# Patient Record
Sex: Male | Born: 1980 | Race: White | Hispanic: No | Marital: Single | Smoking: Current every day smoker
Health system: Southern US, Community
[De-identification: ages and names within clinical notes are randomized; demographics above are authoritative.]

## PROBLEM LIST (undated history)

## (undated) DIAGNOSIS — E119 Type 2 diabetes mellitus without complications: Secondary | ICD-10-CM

---

## 2013-03-10 ENCOUNTER — Emergency Department (HOSPITAL_BASED_OUTPATIENT_CLINIC_OR_DEPARTMENT_OTHER): Payer: Self-pay

## 2013-03-10 ENCOUNTER — Encounter (HOSPITAL_BASED_OUTPATIENT_CLINIC_OR_DEPARTMENT_OTHER): Payer: Self-pay | Admitting: Emergency Medicine

## 2013-03-10 ENCOUNTER — Inpatient Hospital Stay (HOSPITAL_BASED_OUTPATIENT_CLINIC_OR_DEPARTMENT_OTHER)
Admission: EM | Admit: 2013-03-10 | Discharge: 2013-03-17 | DRG: 854 | Disposition: A | Discharge status: Home / Self Care | Payer: Self-pay | Attending: Internal Medicine | Admitting: Internal Medicine

## 2013-03-10 DIAGNOSIS — Z794 Long term (current) use of insulin: Secondary | ICD-10-CM

## 2013-03-10 DIAGNOSIS — M2420 Disorder of ligament, unspecified site: Secondary | ICD-10-CM | POA: Diagnosis present

## 2013-03-10 DIAGNOSIS — E119 Type 2 diabetes mellitus without complications: Secondary | ICD-10-CM | POA: Diagnosis present

## 2013-03-10 DIAGNOSIS — R6510 Systemic inflammatory response syndrome (SIRS) of non-infectious origin without acute organ dysfunction: Secondary | ICD-10-CM

## 2013-03-10 DIAGNOSIS — IMO0001 Reserved for inherently not codable concepts without codable children: Secondary | ICD-10-CM | POA: Diagnosis present

## 2013-03-10 DIAGNOSIS — E1165 Type 2 diabetes mellitus with hyperglycemia: Secondary | ICD-10-CM

## 2013-03-10 DIAGNOSIS — I498 Other specified cardiac arrhythmias: Secondary | ICD-10-CM | POA: Diagnosis present

## 2013-03-10 DIAGNOSIS — L0390 Cellulitis, unspecified: Secondary | ICD-10-CM | POA: Insufficient documentation

## 2013-03-10 DIAGNOSIS — F17200 Nicotine dependence, unspecified, uncomplicated: Secondary | ICD-10-CM | POA: Diagnosis present

## 2013-03-10 DIAGNOSIS — L0291 Cutaneous abscess, unspecified: Secondary | ICD-10-CM | POA: Diagnosis present

## 2013-03-10 DIAGNOSIS — D72829 Elevated white blood cell count, unspecified: Secondary | ICD-10-CM | POA: Diagnosis present

## 2013-03-10 DIAGNOSIS — A419 Sepsis, unspecified organism: Secondary | ICD-10-CM | POA: Diagnosis present

## 2013-03-10 DIAGNOSIS — R000 Tachycardia, unspecified: Secondary | ICD-10-CM | POA: Diagnosis present

## 2013-03-10 DIAGNOSIS — Z79899 Other long term (current) drug therapy: Secondary | ICD-10-CM

## 2013-03-10 DIAGNOSIS — I10 Essential (primary) hypertension: Secondary | ICD-10-CM | POA: Diagnosis present

## 2013-03-10 DIAGNOSIS — R600 Localized edema: Secondary | ICD-10-CM | POA: Diagnosis present

## 2013-03-10 DIAGNOSIS — M629 Disorder of muscle, unspecified: Secondary | ICD-10-CM | POA: Diagnosis present

## 2013-03-10 DIAGNOSIS — F1410 Cocaine abuse, uncomplicated: Secondary | ICD-10-CM | POA: Diagnosis present

## 2013-03-10 DIAGNOSIS — IMO0002 Reserved for concepts with insufficient information to code with codable children: Secondary | ICD-10-CM | POA: Diagnosis present

## 2013-03-10 DIAGNOSIS — A4101 Sepsis due to Methicillin susceptible Staphylococcus aureus: Principal | ICD-10-CM | POA: Diagnosis present

## 2013-03-10 HISTORY — DX: Type 2 diabetes mellitus without complications: E11.9

## 2013-03-10 LAB — CBC WITH DIFFERENTIAL/PLATELET
BAND NEUTROPHILS: 18 % — AB (ref 0–10)
Basophils Absolute: 0 10*3/uL (ref 0.0–0.1)
Basophils Relative: 0 % (ref 0–1)
EOS ABS: 0.7 10*3/uL (ref 0.0–0.7)
Eosinophils Relative: 2 % (ref 0–5)
HEMATOCRIT: 41.1 % (ref 39.0–52.0)
Hemoglobin: 13.9 g/dL (ref 13.0–17.0)
LYMPHS ABS: 1 10*3/uL (ref 0.7–4.0)
Lymphocytes Relative: 3 % — ABNORMAL LOW (ref 12–46)
MCH: 28.3 pg (ref 26.0–34.0)
MCHC: 33.8 g/dL (ref 30.0–36.0)
MCV: 83.5 fL (ref 78.0–100.0)
Monocytes Absolute: 2.3 10*3/uL — ABNORMAL HIGH (ref 0.1–1.0)
Monocytes Relative: 7 % (ref 3–12)
NEUTROS ABS: 29 10*3/uL — AB (ref 1.7–7.7)
Neutrophils Relative %: 70 % (ref 43–77)
Platelets: 302 10*3/uL (ref 150–400)
RBC: 4.92 MIL/uL (ref 4.22–5.81)
RDW: 12.9 % (ref 11.5–15.5)
WBC: 33 10*3/uL — ABNORMAL HIGH (ref 4.0–10.5)

## 2013-03-10 LAB — BASIC METABOLIC PANEL
BUN: 17 mg/dL (ref 6–23)
CO2: 22 mEq/L (ref 19–32)
CREATININE: 0.8 mg/dL (ref 0.50–1.35)
Calcium: 9.7 mg/dL (ref 8.4–10.5)
Chloride: 95 mEq/L — ABNORMAL LOW (ref 96–112)
GFR calc Af Amer: >90 mL/min (ref >90)
GFR calc non Af Amer: >90 mL/min (ref >90)
GLUCOSE: 491 mg/dL — AB (ref 70–99)
Potassium: 4 mEq/L (ref 3.7–5.3)
Sodium: 134 mEq/L — ABNORMAL LOW (ref 137–147)

## 2013-03-10 LAB — GLUCOSE, CAPILLARY
GLUCOSE-CAPILLARY: 226 mg/dL — AB (ref 70–99)
GLUCOSE-CAPILLARY: 250 mg/dL — AB (ref 70–99)

## 2013-03-10 LAB — CG4 I-STAT (LACTIC ACID): LACTIC ACID, VENOUS: 2.78 mmol/L — AB (ref 0.5–2.2)

## 2013-03-10 MED ORDER — SODIUM CHLORIDE 0.9 % IJ SOLN
3.0000 mL | Freq: Two times a day (BID) | INTRAMUSCULAR | Status: DC
  Administered 2013-03-11 – 2013-03-16 (×10): 3 mL via INTRAVENOUS

## 2013-03-10 MED ORDER — VANCOMYCIN HCL IN DEXTROSE 1-5 GM/200ML-% IV SOLN
1000.0000 mg | Freq: Two times a day (BID) | INTRAVENOUS | Status: DC
  Administered 2013-03-11 – 2013-03-15 (×8): 1000 mg via INTRAVENOUS
  Filled 2013-03-10 (×9): qty 200

## 2013-03-10 MED ORDER — PIPERACILLIN-TAZOBACTAM 3.375 G IVPB
3.3750 g | Freq: Three times a day (TID) | INTRAVENOUS | Status: DC
  Administered 2013-03-11 – 2013-03-14 (×11): 3.375 g via INTRAVENOUS
  Filled 2013-03-10 (×13): qty 50

## 2013-03-10 MED ORDER — DOCUSATE SODIUM 100 MG PO CAPS
100.0000 mg | ORAL_CAPSULE | Freq: Two times a day (BID) | ORAL | Status: DC
  Administered 2013-03-11 – 2013-03-17 (×14): 100 mg via ORAL
  Filled 2013-03-10 (×16): qty 1

## 2013-03-10 MED ORDER — SODIUM CHLORIDE 0.9 % IV SOLN
INTRAVENOUS | Status: DC
  Administered 2013-03-11: 09:00:00 via INTRAVENOUS
  Administered 2013-03-11: 1000 mL via INTRAVENOUS
  Administered 2013-03-11 – 2013-03-12 (×2): via INTRAVENOUS
  Administered 2013-03-13: 125 mL/h via INTRAVENOUS
  Administered 2013-03-13: 17:00:00 via INTRAVENOUS
  Administered 2013-03-14: 20 mL/h via INTRAVENOUS
  Administered 2013-03-14 – 2013-03-16 (×2): via INTRAVENOUS

## 2013-03-10 MED ORDER — MORPHINE SULFATE 4 MG/ML IJ SOLN
4.0000 mg | INTRAMUSCULAR | Status: AC | PRN
  Administered 2013-03-10 (×3): 4 mg via INTRAVENOUS
  Filled 2013-03-10 (×3): qty 1

## 2013-03-10 MED ORDER — ENOXAPARIN SODIUM 40 MG/0.4ML ~~LOC~~ SOLN: 40.0000 mg | SUBCUTANEOUS | Status: DC

## 2013-03-10 MED ORDER — ONDANSETRON HCL 4 MG PO TABS: 4.0000 mg | ORAL_TABLET | Freq: Four times a day (QID) | ORAL | Status: DC | PRN

## 2013-03-10 MED ORDER — ACETAMINOPHEN 500 MG PO TABS
1000.0000 mg | ORAL_TABLET | Freq: Once | ORAL | Status: AC
  Administered 2013-03-10: 1000 mg via ORAL

## 2013-03-10 MED ORDER — PIPERACILLIN-TAZOBACTAM 3.375 G IVPB 30 MIN
4.5000 g | Freq: Once | INTRAVENOUS | Status: DC
  Filled 2013-03-10: qty 100

## 2013-03-10 MED ORDER — ONDANSETRON HCL 4 MG/2ML IJ SOLN
4.0000 mg | Freq: Once | INTRAMUSCULAR | Status: AC
  Administered 2013-03-10: 4 mg via INTRAVENOUS
  Filled 2013-03-10: qty 2

## 2013-03-10 MED ORDER — ACETAMINOPHEN 500 MG PO TABS
ORAL_TABLET | ORAL | Status: AC
  Administered 2013-03-10: 1000 mg via ORAL
  Filled 2013-03-10: qty 2

## 2013-03-10 MED ORDER — ONDANSETRON HCL 4 MG/2ML IJ SOLN: 4.0000 mg | Freq: Four times a day (QID) | INTRAMUSCULAR | Status: DC | PRN

## 2013-03-10 MED ORDER — HYDROCODONE-ACETAMINOPHEN 5-325 MG PO TABS
1.0000 | ORAL_TABLET | ORAL | Status: DC | PRN
  Administered 2013-03-11: 1 via ORAL
  Administered 2013-03-11 (×3): 2 via ORAL
  Administered 2013-03-12: 1 via ORAL
  Administered 2013-03-12 – 2013-03-13 (×2): 2 via ORAL
  Filled 2013-03-10: qty 2
  Filled 2013-03-10: qty 1
  Filled 2013-03-10 (×4): qty 2

## 2013-03-10 MED ORDER — PANTOPRAZOLE SODIUM 40 MG PO TBEC
40.0000 mg | DELAYED_RELEASE_TABLET | Freq: Every day | ORAL | Status: DC
  Administered 2013-03-11 – 2013-03-16 (×6): 40 mg via ORAL
  Filled 2013-03-10 (×6): qty 1

## 2013-03-10 MED ORDER — SODIUM CHLORIDE 0.9 % IV BOLUS (SEPSIS)
1000.0000 mL | Freq: Once | INTRAVENOUS | Status: AC
  Administered 2013-03-10: 1000 mL via INTRAVENOUS

## 2013-03-10 MED ORDER — INSULIN ASPART 100 UNIT/ML ~~LOC~~ SOLN
0.0000 [IU] | SUBCUTANEOUS | Status: DC
  Administered 2013-03-11: 3 [IU] via SUBCUTANEOUS
  Administered 2013-03-11: 5 [IU] via SUBCUTANEOUS
  Administered 2013-03-11: 3 [IU] via SUBCUTANEOUS

## 2013-03-10 MED ORDER — ACETAMINOPHEN 325 MG PO TABS
650.0000 mg | ORAL_TABLET | Freq: Four times a day (QID) | ORAL | Status: DC | PRN
  Administered 2013-03-11 – 2013-03-13 (×2): 650 mg via ORAL
  Filled 2013-03-10 (×2): qty 2

## 2013-03-10 MED ORDER — VANCOMYCIN HCL 10 G IV SOLR
1500.0000 mg | Freq: Once | INTRAVENOUS | Status: AC
  Filled 2013-03-10: qty 1500

## 2013-03-10 MED ORDER — ACETAMINOPHEN 650 MG RE SUPP: 650.0000 mg | Freq: Four times a day (QID) | RECTAL | Status: DC | PRN

## 2013-03-10 MED ORDER — VANCOMYCIN HCL 500 MG IV SOLR
INTRAVENOUS | Status: AC
  Administered 2013-03-10: 1500 mg via INTRAVENOUS
  Filled 2013-03-10: qty 1500

## 2013-03-10 NOTE — ED Provider Notes (Addendum)
CSN: [id number]     Arrival date & time 03/10/13  1708 History   First MD Initiated Contact with Patient 03/10/13 1731     Chief Complaint  Patient presents with  . Recurrent Skin Infections    HPI  Patient presents here with a small reddened area on his right arm. His left arm is red and markedly swollen. He has had fever at home. He regularly uses IV cocaine. Has had sugars abscesses in the past. Has history of non-insulin-dependent diabetes. No chest pain. Her shortness of breath. No rash or hemorrhagic lesions to his extremities. No vision changes no headaches  Past Medical History  Diagnosis Date  . Diabetes mellitus without complication    History reviewed. No pertinent past surgical history. No family history on file. History  Substance Use Topics  . Smoking status: Current Every Day Smoker  . Smokeless tobacco: Not on file  . Alcohol Use: No    Review of Systems  Constitutional: Positive for fever and fatigue. Negative for chills, diaphoresis and appetite change.  HENT: Negative for mouth sores, sore throat and trouble swallowing.   Eyes: Negative for visual disturbance.  Respiratory: Negative for cough, chest tightness, shortness of breath and wheezing.   Cardiovascular: Negative for chest pain.  Gastrointestinal: Negative for nausea, vomiting, abdominal pain, diarrhea and abdominal distention.  Endocrine: Negative for polydipsia, polyphagia and polyuria.  Genitourinary: Negative for dysuria, frequency and hematuria.  Musculoskeletal: Positive for myalgias. Negative for gait problem.       Bilateral arm pain. Left much greater than right.  Skin: Positive for color change and wound. Negative for pallor and rash.  Neurological: Negative for dizziness, syncope, light-headedness and headaches.  Hematological: Does not bruise/bleed easily.  Psychiatric/Behavioral: Negative for behavioral problems and confusion.    Allergies  Review of patient's allergies indicates no  known allergies.  Home Medications   Current Outpatient Rx  Name  Route  Sig  Dispense  Refill  . metFORMIN (GLUCOPHAGE) 1000 MG tablet   Oral   Take 1,000 mg by mouth 2 (two) times daily with a meal.          BP 161/91  Pulse 132  Temp(Src) 98.7 F (37.1 C) (Oral)  Resp 20  Ht 5\' 11"  (1.803 m)  Wt 224 lb (101.606 kg)  BMI 31.26 kg/m2  SpO2 100% Physical Exam  Constitutional: He is oriented to person, place, and time. He appears distressed.  An obese adult male. He get neck at 25-30 breaths per minute. Heart rate 130. Mentating well.  HENT:  Head: Normocephalic.  No abnormalities noted of his retina. No scleral hemorrhages.  Eyes: Conjunctivae are normal. Pupils are equal, round, and reactive to light. No scleral icterus.  Neck: Normal range of motion. Neck supple. No thyromegaly present.  No JVD.  Cardiovascular: Normal rate and regular rhythm.  Exam reveals no gallop and no friction rub.   No murmur heard. Tachycardic. No murmurs auscultated. He states any history murmurs as a child but not auscultated today.  Pulmonary/Chest: Effort normal and breath sounds normal. No respiratory distress. He has no wheezes. He has no rales.  Tachypneic but clear lungs well oxygenated  Abdominal: Soft. Bowel sounds are normal. He exhibits no distension. There is no tenderness. There is no rebound.  Musculoskeletal: Normal range of motion.       Arms: Neurological: He is alert and oriented to person, place, and time.  Skin: Skin is warm and dry. No rash noted.  Psychiatric: He  has a normal mood and affect. His behavior is normal.    ED Course  Procedures (including critical care time) Labs Review Labs Reviewed  CBC WITH DIFFERENTIAL - Abnormal; Notable for the following:    WBC 33.0 (*)    Lymphocytes Relative 3 (*)    Band Neutrophils 18 (*)    Neutro Abs 29.0 (*)    Monocytes Absolute 2.3 (*)    All other components within normal limits  BASIC METABOLIC PANEL - Abnormal;  Notable for the following:    Sodium 134 (*)    Chloride 95 (*)    Glucose, Bld 491 (*)    All other components within normal limits  CG4 I-STAT (LACTIC ACID) - Abnormal; Notable for the following:    Lactic Acid, Venous 2.78 (*)    All other components within normal limits  CULTURE, BLOOD (SINGLE)   Imaging Review No results found.  EKG Interpretation   None       MDM   1. Cellulitis    Compresses swelling of his arm. Lactic acid is minimally elevated. His hyperglycemia. Renal function is intact. Blood culture was obtained. Was given IV vancomycin. Using a bedside ultrasound I was able to isolate cannulate the vein in his right upper arm. Labs were obtained and it is infusing well.  Discussed case with Dr.          Rennis Pettyf [facility].  Plain film imaging of the arm and forearm are recommended. I think this is an excellent idea. These were obtained and do not show any sign of subcutaneous gas. Recheck temperature is 99.7. Heart rate is 129. His vancomycin is infusing. His only been given 500 cc of fluid. This is due to affect is a little peripheral IV. It is infusing well. We will continue to additional fluid boluses after his IV antibiotics are infused. [facility]. Step down bed has been ordered at hospitalist request because of persistent heart rate. His blood pressure is adequate at 147/85 he is oxygenating and mentating well.  Rolland PorterMark Camay Pedigo, MD 03/10/13 1926  Rolland PorterMark Craigory Toste, MD 03/10/13 [id number]

## 2013-03-10 NOTE — ED Notes (Signed)
Patients left arm is swollen, reddened and painful.  Accesses to both arms where patient uses IV drugs. States that left arm has been getting worse over three days, right arm is also reddened slightly.

## 2013-03-10 NOTE — ED Notes (Signed)
Patient transported to X-ray 

## 2013-03-10 NOTE — ED Notes (Signed)
MD at bedside. 

## 2013-03-10 NOTE — ED Notes (Signed)
I had to shave patient chest hair to get monitor leads to stay attached, patient is diaphorettic causeing lead wire pads to loosen as well.

## 2013-03-10 NOTE — ED Notes (Signed)
Patient awoke during my getting new vitals, he did not want me to get BP due to arm pain.

## 2013-03-10 NOTE — H&P (Addendum)
PCP: Dr. Welton Flakes in [place]    Chief Complaint:  Arm swelling  HPI: Jay Snyder is a 33 y.o. male   has a past medical history of Diabetes mellitus without complication.   Presented with  3-4 day hx of Left arm and right arm swelling. Patient was injecting cocaine and started to develop severe arm swelling. Reports fevers for the past few days up to 102.2. Denies cough or sore throat. No chest pain.  Patient is diabetic on metformin. Patient has been transferred to stepdown at [facility]. consulted orthopedics Dr. Roda Shutters who will evaluate patient given severity of infection.   Review of Systems:    Pertinent positives include: Fevers, chills,   Constitutional:  No weight loss, night sweats, fatigue, weight loss  HEENT:  No headaches, Difficulty swallowing,Tooth/dental problems,Sore throat,  No sneezing, itching, ear ache, nasal congestion, post nasal drip,  Cardio-vascular:  No chest pain, Orthopnea, PND, anasarca, dizziness, palpitations.no Bilateral lower extremity swelling  GI:  No heartburn, indigestion, abdominal pain, nausea, vomiting, diarrhea, change in bowel habits, loss of appetite, melena, blood in stool, hematemesis Resp:  no shortness of breath at rest. No dyspnea on exertion, No excess mucus, no productive cough, No non-productive cough, No coughing up of blood.No change in color of mucus.No wheezing. Skin:  no rash or lesions. No jaundice GU:  no dysuria, change in color of urine, no urgency or frequency. No straining to urinate.  No flank pain.  Musculoskeletal:  No joint pain or no joint swelling. No decreased range of motion. No back pain.  Psych:  No change in mood or affect. No depression or anxiety. No memory loss.  Neuro: no localizing neurological complaints, no tingling, no weakness, no double vision, no gait abnormality, no slurred speech, no confusion  Otherwise ROS are negative except for above, 10 systems were reviewed  Past Medical History: Past Medical  History  Diagnosis Date  . Diabetes mellitus without complication    History reviewed. No pertinent past surgical history.   Medications: Prior to Admission medications   Medication Sig Start Date End Date Taking? Authorizing Provider  metFORMIN (GLUCOPHAGE) 1000 MG tablet Take 1,000 mg by mouth 2 (two) times daily with a meal.   Yes Historical Provider, MD    Allergies:  No Known Allergies  Social History:  Ambulatory  Independently  Lives at  Home with family fiance is aware of drug abuse problem    reports that he has been smoking.  He does not have any smokeless tobacco history on file. He reports that he uses illicit drugs (IV and Cocaine). He reports that he does not drink alcohol.   Family History: family history includes Diabetes Mellitus II in his father and mother.    Physical Exam: Patient Vitals for the past 24 hrs:  BP Temp Temp src Pulse Resp SpO2 Height Weight  03/10/13 2307 138/66 mmHg 99.4 F (37.4 C) Oral 118 25 97 % - 105.6 kg (232 lb 12.9 oz)  03/10/13 2211 139/88 mmHg - - 128 - 98 % - -  03/10/13 2147 - 101.1 F (38.4 C) Oral - - - - -  03/10/13 2146 - - - 123 - 96 % - -  03/10/13 2111 140/77 mmHg 99.3 F (37.4 C) Oral 127 20 96 % - -  03/10/13 1723 161/91 mmHg 98.7 F (37.1 C) Oral 132 20 100 % 5\' 11"  (1.803 m) 101.606 kg (224 lb)    1. General:  in No Acute distress 2. Psychological: Alert and  Oriented 3. Head/ENT:   Moist   Mucous Membranes                          Head Non traumatic, neck supple                          Normal  Dentition 4. SKIN:  decreased Skin turgor,  Skin clean Dry and intact. Left upper ext significant swelling and erythema. Right upper ext with area of induration measuring 3 cm 5. Heart: Regular rate and rhythm no Murmur, Rub or gallop 6. Lungs: Clear to auscultation bilaterally, no wheezes or crackles   7. Abdomen: Soft, non-tender, Non distended 8. Lower extremities: no clubbing, cyanosis, or edema 9.  Neurologically Grossly intact, moving all 4 extremities equally 10. MSK: Normal range of motion  body mass index is 32.48 kg/(m^2).   Labs on Admission:   Recent Labs  03/10/13 1810  NA 134*  K 4.0  CL 95*  CO2 22  GLUCOSE 491*  BUN 17  CREATININE 0.80  CALCIUM 9.7   No results found for this basename: AST, ALT, ALKPHOS, BILITOT, PROT, ALBUMIN,  in the last 72 hours No results found for this basename: LIPASE, AMYLASE,  in the last 72 hours  Recent Labs  03/10/13 1810  WBC 33.0*  NEUTROABS 29.0*  HGB 13.9  HCT 41.1  MCV 83.5  PLT 302   No results found for this basename: CKTOTAL, CKMB, CKMBINDEX, TROPONINI,  in the last 72 hours No results found for this basename: TSH, T4TOTAL, FREET3, T3FREE, THYROIDAB,  in the last 72 hours No results found for this basename: VITAMINB12, FOLATE, FERRITIN, TIBC, IRON, RETICCTPCT,  in the last 72 hours No results found for this basename: HGBA1C    Estimated Creatinine Clearance: 166.9 ml/min (by C-G formula based on Cr of 0.8). ABG No results found for this basename: phart, pco2, po2, hco3, tco2, acidbasedef, o2sat     No results found for this basename: DDIMER    Cultures: No results found for this basename: sdes, specrequest, cult, reptstatus       Radiological Exams on Admission: Dg Forearm Left  03/10/2013   CLINICAL DATA:  Abscess posterior forearm.  EXAM: LEFT FOREARM - 2 VIEW  COMPARISON:  None.  FINDINGS: Imaged bones, joints and soft tissues appear normal.  IMPRESSION: Negative exam.   Electronically Signed   By: Drusilla Kanner M.D.   On: 03/10/2013 20:34   Dg Humerus Left  03/10/2013   CLINICAL DATA:  Abscess posterior forearm.  EXAM: LEFT HUMERUS - 2+ VIEW  COMPARISON:  None.  FINDINGS: Imaged bones, joints and soft tissues appear normal.  IMPRESSION: Negative exam.   Electronically Signed   By: Drusilla Kanner M.D.   On: 03/10/2013 20:33    Chart has been reviewed  Assessment/Plan   33 yo  M w hx of drug  abuse here with severe left arm cellulitis with possible elbow joint involvement with evidence of SIRS  Present on Admission:   . Cellulitis and abscess - broad-spectrum antibiotics including vancomycin and Zosyn, n.p.o. for possible procedure, MRI in the a.m. of humerus and forearm as well as elbow to evaluate for any drainable abscess spoke with orthopedics for repair with patient and will evaluate  . Type II or unspecified type diabetes mellitus without mention of complication, not stated as uncontrolled - sliding scale, hold metformin SIRS - cover with broad spectrum antibiotics.  IVF, watch for sepsis  Prophylaxis:  SCD, protonix  CODE STATUS: FULL CODE  Other plan as per orders.  I have spent a total of 65 min on this admission, time taken to discuss care with orthopedics and radiology  Alizzon Dioguardi 03/10/2013, 11:58 PM

## 2013-03-10 NOTE — Progress Notes (Signed)
ANTIBIOTIC CONSULT NOTE - INITIAL  Pharmacy Consult for vancomycin Indication: cellulitis  No Known Allergies  Patient Measurements: Height: 5\' 11"  (180.3 cm) Weight: 224 lb (101.606 kg) IBW/kg (Calculated) : 75.3  Vital Signs: Temp: 99.4 F (37.4 C) (02/01 2307) Temp src: Oral (02/01 2307) BP: 139/88 mmHg (02/01 2211) Pulse Rate: 128 (02/01 2211)  Labs:  Recent Labs  03/10/13 1810  WBC 33.0*  HGB 13.9  PLT 302  CREATININE 0.80   Estimated Creatinine Clearance: 163.9 ml/min (by C-G formula based on Cr of 0.8).   Microbiology: No results found for this or any previous visit (from the past 720 hour(s)).  Medical History: Past Medical History  Diagnosis Date  . Diabetes mellitus without complication     Medications:  Prescriptions prior to admission  Medication Sig Dispense Refill  . metFORMIN (GLUCOPHAGE) 1000 MG tablet Take 1,000 mg by mouth 2 (two) times daily with a meal.       Scheduled:  . docusate sodium  100 mg Oral BID  . enoxaparin (LOVENOX) injection  40 mg Subcutaneous Q24H  . [START ON 03/11/2013] insulin aspart  0-9 Units Subcutaneous Q4H  . [START ON 03/11/2013] pantoprazole  40 mg Oral Q1200  . piperacillin-tazobactam (ZOSYN)  IV  3.375 g Intravenous Q8H  . piperacillin-tazobactam  4.5 g Intravenous Once  . sodium chloride  3 mL Intravenous Q12H  . [START ON 03/11/2013] vancomycin  1,000 mg Intravenous Q12H   Infusions:  . sodium chloride      Assessment: 33yo male c/o swelling, redness, and pain of LUE that has worsened over 3d, uses IV drugs, to begin IV ABX for cellulitis.  Goal of Therapy:  Vancomycin trough level 10-15 mcg/ml  Plan:  Rec'd vanc 1500mg  IV in ED; will continue with vancomycin 1000mg  IV Q12H and monitor CBC, Cx, levels prn.  Vernard GamblesVeronda Suraiya Dickerson, PharmD, BCPS  03/10/2013,11:49 PM

## 2013-03-11 ENCOUNTER — Inpatient Hospital Stay (HOSPITAL_COMMUNITY): Payer: Self-pay

## 2013-03-11 DIAGNOSIS — L0390 Cellulitis, unspecified: Secondary | ICD-10-CM

## 2013-03-11 DIAGNOSIS — E1165 Type 2 diabetes mellitus with hyperglycemia: Secondary | ICD-10-CM | POA: Diagnosis present

## 2013-03-11 DIAGNOSIS — D72829 Elevated white blood cell count, unspecified: Secondary | ICD-10-CM | POA: Diagnosis present

## 2013-03-11 DIAGNOSIS — R600 Localized edema: Secondary | ICD-10-CM | POA: Diagnosis present

## 2013-03-11 DIAGNOSIS — A419 Sepsis, unspecified organism: Secondary | ICD-10-CM | POA: Diagnosis present

## 2013-03-11 DIAGNOSIS — R000 Tachycardia, unspecified: Secondary | ICD-10-CM | POA: Diagnosis present

## 2013-03-11 DIAGNOSIS — IMO0002 Reserved for concepts with insufficient information to code with codable children: Secondary | ICD-10-CM | POA: Diagnosis present

## 2013-03-11 DIAGNOSIS — L0291 Cutaneous abscess, unspecified: Secondary | ICD-10-CM

## 2013-03-11 LAB — COMPREHENSIVE METABOLIC PANEL
ALK PHOS: 181 U/L — AB (ref 39–117)
ALT: 31 U/L (ref 0–53)
AST: 34 U/L (ref 0–37)
Albumin: 2.1 g/dL — ABNORMAL LOW (ref 3.5–5.2)
BUN: 11 mg/dL (ref 6–23)
CHLORIDE: 95 meq/L — AB (ref 96–112)
CO2: 22 meq/L (ref 19–32)
Calcium: 8.8 mg/dL (ref 8.4–10.5)
Creatinine, Ser: 0.64 mg/dL (ref 0.50–1.35)
GLUCOSE: 192 mg/dL — AB (ref 70–99)
POTASSIUM: 3.5 meq/L — AB (ref 3.7–5.3)
Sodium: 132 mEq/L — ABNORMAL LOW (ref 137–147)
Total Bilirubin: 0.2 mg/dL — ABNORMAL LOW (ref 0.3–1.2)
Total Protein: 6.5 g/dL (ref 6.0–8.3)

## 2013-03-11 LAB — GLUCOSE, CAPILLARY
GLUCOSE-CAPILLARY: 118 mg/dL — AB (ref 70–99)
Glucose-Capillary: 214 mg/dL — ABNORMAL HIGH (ref 70–99)
Glucose-Capillary: 271 mg/dL — ABNORMAL HIGH (ref 70–99)
Glucose-Capillary: 152 mg/dL — ABNORMAL HIGH (ref 70–99)
Glucose-Capillary: 211 mg/dL — ABNORMAL HIGH (ref 70–99)

## 2013-03-11 LAB — CBC
HCT: 38.3 % — ABNORMAL LOW (ref 39.0–52.0)
Hemoglobin: 13.4 g/dL (ref 13.0–17.0)
MCH: 28.8 pg (ref 26.0–34.0)
MCHC: 35 g/dL (ref 30.0–36.0)
MCV: 82.4 fL (ref 78.0–100.0)
Platelets: 281 10*3/uL (ref 150–400)
RBC: 4.65 MIL/uL (ref 4.22–5.81)
RDW: 12.7 % (ref 11.5–15.5)
WBC: 27.9 10*3/uL — AB (ref 4.0–10.5)

## 2013-03-11 LAB — HEMOGLOBIN A1C
Hgb A1c MFr Bld: 9.8 % — ABNORMAL HIGH (ref <5.7)
Mean Plasma Glucose: 235 mg/dL — ABNORMAL HIGH (ref <117)

## 2013-03-11 LAB — LACTIC ACID, PLASMA: LACTIC ACID, VENOUS: 1.1 mmol/L (ref 0.5–2.2)

## 2013-03-11 LAB — MRSA PCR SCREENING: MRSA by PCR: NEGATIVE

## 2013-03-11 LAB — MAGNESIUM: Magnesium: 1.7 mg/dL (ref 1.5–2.5)

## 2013-03-11 LAB — TSH: TSH: 1.496 u[IU]/mL (ref 0.350–4.500)

## 2013-03-11 LAB — PHOSPHORUS: Phosphorus: 2.3 mg/dL (ref 2.3–4.6)

## 2013-03-11 LAB — HIV ANTIBODY (ROUTINE TESTING W REFLEX): HIV: NONREACTIVE

## 2013-03-11 MED ORDER — FAMOTIDINE IN NACL 20-0.9 MG/50ML-% IV SOLN
20.0000 mg | Freq: Two times a day (BID) | INTRAVENOUS | Status: DC
  Administered 2013-03-11 – 2013-03-12 (×3): 20 mg via INTRAVENOUS
  Filled 2013-03-11 (×4): qty 50

## 2013-03-11 MED ORDER — KETOROLAC TROMETHAMINE 15 MG/ML IJ SOLN
15.0000 mg | Freq: Three times a day (TID) | INTRAMUSCULAR | Status: DC
  Administered 2013-03-11: 15 mg via INTRAVENOUS
  Filled 2013-03-11 (×3): qty 1

## 2013-03-11 MED ORDER — MORPHINE SULFATE 2 MG/ML IJ SOLN
2.0000 mg | INTRAMUSCULAR | Status: DC | PRN
  Administered 2013-03-12 – 2013-03-17 (×34): 2 mg via INTRAVENOUS
  Filled 2013-03-11 (×35): qty 1

## 2013-03-11 MED ORDER — DOXYCYCLINE HYCLATE 100 MG PO TABS
100.0000 mg | ORAL_TABLET | Freq: Two times a day (BID) | ORAL | Status: DC
  Administered 2013-03-11: 100 mg via ORAL
  Filled 2013-03-11 (×3): qty 1

## 2013-03-11 MED ORDER — OXYCODONE HCL 5 MG PO TABS: 5.0000 mg | ORAL_TABLET | ORAL | Status: DC | PRN

## 2013-03-11 MED ORDER — HEPARIN SODIUM (PORCINE) 5000 UNIT/ML IJ SOLN
5000.0000 [IU] | Freq: Three times a day (TID) | INTRAMUSCULAR | Status: DC
  Administered 2013-03-11: 5000 [IU] via SUBCUTANEOUS
  Filled 2013-03-11 (×3): qty 1

## 2013-03-11 MED ORDER — LEVOFLOXACIN 750 MG PO TABS
750.0000 mg | ORAL_TABLET | Freq: Every day | ORAL | Status: DC
  Administered 2013-03-11 – 2013-03-13 (×3): 750 mg via ORAL
  Filled 2013-03-11 (×3): qty 1

## 2013-03-11 MED ORDER — INSULIN ASPART 100 UNIT/ML ~~LOC~~ SOLN
0.0000 [IU] | SUBCUTANEOUS | Status: DC
Start: 2013-03-11 — End: 2013-03-17
  Administered 2013-03-11: 5 [IU] via SUBCUTANEOUS
  Administered 2013-03-11: 8 [IU] via SUBCUTANEOUS
  Administered 2013-03-12 – 2013-03-13 (×5): 3 [IU] via SUBCUTANEOUS
  Administered 2013-03-13: 5 [IU] via SUBCUTANEOUS
  Administered 2013-03-13 (×2): 3 [IU] via SUBCUTANEOUS
  Administered 2013-03-13: 8 [IU] via SUBCUTANEOUS
  Administered 2013-03-13: 3 [IU] via SUBCUTANEOUS
  Administered 2013-03-14: 5 [IU] via SUBCUTANEOUS
  Administered 2013-03-14: 3 [IU] via SUBCUTANEOUS
  Administered 2013-03-14: 8 [IU] via SUBCUTANEOUS
  Administered 2013-03-14 – 2013-03-15 (×6): 5 [IU] via SUBCUTANEOUS
  Administered 2013-03-16 (×3): 3 [IU] via SUBCUTANEOUS
  Administered 2013-03-16 (×2): 5 [IU] via SUBCUTANEOUS
  Administered 2013-03-17 (×3): 2 [IU] via SUBCUTANEOUS

## 2013-03-11 MED ORDER — INSULIN DETEMIR 100 UNIT/ML ~~LOC~~ SOLN
10.0000 [IU] | Freq: Every day | SUBCUTANEOUS | Status: DC
  Administered 2013-03-11 – 2013-03-17 (×7): 10 [IU] via SUBCUTANEOUS
  Filled 2013-03-11 (×7): qty 0.1

## 2013-03-11 NOTE — Progress Notes (Addendum)
Jay Snyder - Stepdown / ICU Progress Note  Jay Snyder:[id number] DOB: 15-Oct-1980 DOA: 2/Snyder/2015 PCP: No primary provider on file.  Brief narrative: 33 year old male patient known diabetic on metformin at home. Presented with a 3-4 day history of severe left arm swelling. Patient developed the symptoms after injecting cocaine into his arm. Also endorsed fever up to 102.2.   Assessment/Plan:    Sepsis -Source upper extremity cellulitis -BP stable and actually hypertensive but remains tachycardic and tachypneic -Continue empiric treatment with antibiotics and other supportive care    Cellulitis and abscess / Edema of upper extremity -Some swelling right upper extremity but primarily left upper extremity -MRI pending -Appreciate orthopedic assistance-n.p.o. until MRI completed in event patient needs to go to the OR based on MRI findings -Monitor closely for signs of impending compartment syndrome -Treat pain with Tylenol, morphine, oxycodone -Begin scheduled low-dose IV Toradol every 8 hours for 6 doses only-use twice a day Pepcid for GI protection -Unclear if true IV drug abuse or skin popping but as precaution since only one blood culture was obtained in the ER we'll obtain another set    Limited IV access -likely due to IVD use + sepsis - unable to obtain R arm PICC today due to fear of local cellulitis - asked IR to place CVL in AM - also called PCCM to place CVL tonight if able as I would like access sooner than later w/ sepsis and potential for rapid decompensation - dose w/ oral levaquin (broad coverage in a diabetic) until IV abx able to be given     Diabetes mellitus type 2, uncontrolled -Begin low-dose Levemir with moderate sliding scale insulin -Check hemoglobin A1c    Sinus tachycardia -does not appear to be dehydrated and seems to be more of a marker of acute inflammatory response and sepsis -IV fluids while n.p.o.  DVT prophylaxis: SCDs only for now in case  pt needs to go to OR emergently  Code Status: Full Family Communication: Family member at bedside Disposition Plan/Expected LOS: Remain in step down  Consultants: Orthopedics  Procedures: None  Antibiotics: Zosyn 2/Snyder >>> Vancomycin 2/Snyder >>> Oral Levaquin 2/2 >>  HPI/Subjective: Patient alert and complaining of some mild discomfort left upper extremity. No chest pain or shortness of breath  Objective: Blood pressure 140/96, pulse 115, temperature 99.4 F (37.4 C), temperature source Oral, resp. rate 29, height 5\' 11"  (Snyder.803 m), weight 232 lb 12.9 oz (105.6 kg), SpO2 92.00%.  Intake/Output Summary (Last 24 hours) at 03/11/13 1404 Last data filed at 03/11/13 1058  Gross per 24 hour  Intake 1043.58 ml  Output   1325 ml  Net -281.42 ml   Exam: General: No acute respiratory distress but has resting tachypnea-somewhat toxic appearing Lungs: Clear to auscultation bilaterally without wheezes or crackles, RA Cardiovascular: Regular tachycardic rate is less than 120 beats per minute and rhythm without murmur gallop or rub normal S1 and S2, no peripheral edema or JVD Abdomen: Nontender, nondistended, soft, bowel sounds positive, no rebound, no ascites, no appreciable mass Musculoskeletal: No significant cyanosis, clubbing of bilateral lower extremities - L arm markedly swollen but no paralysis or palor and pt denies parasthesias Neurological: Alert and oriented x 3, moves all extremities x 4 without focal neurological deficits, CN 2-12 intact  Scheduled Meds:  Scheduled Meds: . docusate sodium  100 mg Oral BID  . famotidine (PEPCID) IV  20 mg Intravenous Q12H  . insulin aspart  0-15 Units Subcutaneous Q4H  .  insulin detemir  10 Units Subcutaneous Daily  . ketorolac  15 mg Intravenous Q8H  . pantoprazole  40 mg Oral Q1200  . piperacillin-tazobactam (ZOSYN)  IV  3.375 g Intravenous Q8H  . piperacillin-tazobactam  4.5 g Intravenous Once  . sodium chloride  3 mL Intravenous Q12H  .  vancomycin  Snyder,000 mg Intravenous Q12H    Data Reviewed: Basic Metabolic Panel:  Recent Labs Lab 03/10/13 1810 03/11/13 0130  NA 134* 132*  K 4.0 3.5*  CL 95* 95*  CO2 22 22  GLUCOSE 491* 192*  BUN 17 11  CREATININE 0.80 0.64  CALCIUM 9.7 8.8  MG  --  Snyder.7  PHOS  --  2.3   Liver Function Tests:  Recent Labs Lab 03/11/13 0130  AST 34  ALT 31  ALKPHOS 181*  BILITOT 0.2*  PROT 6.5  ALBUMIN 2.Snyder*   CBC:  Recent Labs Lab 03/10/13 1810 03/11/13 0130  WBC 33.0* 27.9*  NEUTROABS 29.0*  --   HGB 13.9 13.4  HCT 41.Snyder 38.3*  MCV 83.5 82.4  PLT 302 281   CBG:  Recent Labs Lab 03/10/13 2213 03/10/13 2354 03/11/13 0432 03/11/13 0726 03/11/13 1207  GLUCAP 250* 226* 214* 271* 152*    Recent Results (from the past 240 hour(s))  MRSA PCR SCREENING     Status: None   Collection Time    03/10/13 11:07 PM      Result Value Range Status   MRSA by PCR NEGATIVE  NEGATIVE Final   Comment:            The GeneXpert MRSA Assay (FDA     approved for NASAL specimens     only), is one component of a     comprehensive MRSA colonization     surveillance program. It is not     intended to diagnose MRSA     infection nor to guide or     monitor treatment for     MRSA infections.     Studies:  Recent x-ray studies have been reviewed in detail by the Attending Physician  Time spent :  35mins     Junious Silkllison Ellis, ANP [facility] Office  [phone] Pager [phone]  **If unable to reach the above provider after paging please contact the Flow Manager @ [phone]  On-Call/Text Page:      [email or web address]      password [email or web address]  If 7PM-7AM, please contact night-coverage [email or web address] Password [email or web address] 03/11/2013, 2:04 PM   LOS: Snyder day   I have personally examined this patient and reviewed the entire database. I have reviewed the above note, made any necessary editorial changes, and agree with its content.  Lonia BloodJeffrey T. Aireana Ryland, MD [facility]

## 2013-03-11 NOTE — Procedures (Signed)
Central Venous Catheter Insertion Procedure Note Jay BobJeremy Snyder [id number] 05/10/1980  Procedure: Insertion of Central Venous Catheter Indications: Drug and/or fluid administration  Procedure Details Consent: Risks of procedure as well as the alternatives and risks of each were explained to the (patient/caregiver).  Consent for procedure obtained. Time Out: Verified patient identification, verified procedure, site/side was marked, verified correct patient position, special equipment/implants available, medications/allergies/relevent history reviewed, required imaging and test results available.  Performed  Maximum sterile technique was used including antiseptics, cap, gloves, gown, hand hygiene, mask and sheet. Skin prep: Chlorhexidine; local anesthetic administered A antimicrobial bonded/coated triple lumen catheter was placed in the right internal jugular vein using the Seldinger technique.  Evaluation Blood flow good Complications: No apparent complications Patient did tolerate procedure well. Chest X-ray ordered to verify placement.  CXR: pending.  Performed by Jay BrimBrandi Ollis, NP with u/s guidance.  I was present for procedure.  Jay HellingVineet Roberta Angell, MD [facility] 03/11/2013, 9:33 PM Pager:  [phone] After 3pm call: [phone]

## 2013-03-11 NOTE — Progress Notes (Signed)
Inpatient Diabetes Program Recommendations  AACE/ADA: New Consensus Statement on Inpatient Glycemic Control (2013)  Target Ranges:  Prepandial:   less than 140 mg/dL      Peak postprandial:   less than 180 mg/dL (1-2 hours)      Critically ill patients:  140 - 180 mg/dL  Results for Phylliss BobANCE, Rahmel (MRN [id number]) as of 03/11/2013 08:26  Ref. Range 03/10/2013 22:13 03/10/2013 23:54 03/11/2013 04:32 03/11/2013 07:26  Glucose-Capillary Latest Range: 70-99 mg/dL 657250 (H) 846226 (H) 962214 (H) 271 (H)   Diabetes history: T-2 Outpatient Diabetes medications: Metformin Current orders for Inpatient glycemic control: Novolog sensitive scale q4   Inpatient Diabetes Program Recommendations Insulin - Basal: consider adding low dose basal Lantus or Levemir 15 units  HgbA1C: pending  Will follow. Thank you  Piedad ClimesGina Jayln Branscom BSN, RN,CDE Inpatient Diabetes Coordinator [phone] (team pager)

## 2013-03-11 NOTE — Plan of Care (Signed)
Solitary blood culture ordered in ED therefore one set of blood cx's x 2 ordered now. BP up so [facility]  Junious SilkAllison Ellis, ANP

## 2013-03-11 NOTE — Progress Notes (Signed)
Orthopedic Tech Progress Note Patient Details:  Jay BobJeremy Snyder 29-Jan-1981 [id number]  Ortho Devices Type of Ortho Device: Sling immobilizer   [name], [name] 03/11/2013, 8:12 AM

## 2013-03-11 NOTE — Consult Note (Signed)
   ORTHOPAEDIC CONSULTATION  REQUESTING PHYSICIAN: Lonia BloodJeffrey T McClung, MD  Chief Complaint: Left arm pain and swelling  HPI: Phylliss BobJeremy Alfred is a 33 y.o. male who complains of left arm swelling and pain.  Recently was injecting cocaine into his arm.  Has had prior h/o this in the other arm.  Has had fevers up to 102.2 in the last few days.  He is diabetic.   Past Medical History  Diagnosis Date  . Diabetes mellitus without complication    History reviewed. No pertinent past surgical history. History   Social History  . Marital Status: Single    Spouse Name: N/A    Number of Children: N/A  . Years of Education: N/A   Social History Main Topics  . Smoking status: Current Every Day Smoker  . Smokeless tobacco: None  . Alcohol Use: No  . Drug Use: Yes    Special: IV, Cocaine  . Sexual Activity: None   Other Topics Concern  . None   Social History Narrative  . None   Family History  Problem Relation Age of Onset  . Diabetes Mellitus II Mother   . Diabetes Mellitus II Father    No Known Allergies Prior to Admission medications   Medication Sig Start Date End Date Taking? Authorizing Provider  metFORMIN (GLUCOPHAGE) 1000 MG tablet Take 1,000 mg by mouth 2 (two) times daily with a meal.   Yes Historical Provider, MD   Dg Forearm Left  03/10/2013   CLINICAL DATA:  Abscess posterior forearm.  EXAM: LEFT FOREARM - 2 VIEW  COMPARISON:  None.  FINDINGS: Imaged bones, joints and soft tissues appear normal.  IMPRESSION: Negative exam.   Electronically Signed   By: Drusilla Kannerhomas  Dalessio M.D.   On: 03/10/2013 20:34   Dg Humerus Left  03/10/2013   CLINICAL DATA:  Abscess posterior forearm.  EXAM: LEFT HUMERUS - 2+ VIEW  COMPARISON:  None.  FINDINGS: Imaged bones, joints and soft tissues appear normal.  IMPRESSION: Negative exam.   Electronically Signed   By: Drusilla Kannerhomas  Dalessio M.D.   On: 03/10/2013 20:33    Positive ROS: All other systems have been reviewed and were otherwise negative with the  exception of those mentioned in the HPI and as above.  Physical Exam: General: Alert, no acute distress Cardiovascular: No pedal edema Respiratory: No cyanosis, no use of accessory musculature GI: No organomegaly, abdomen is soft and non-tender Skin: No lesions in the area of chief complaint Neurologic: Sensation intact distally Psychiatric: Patient is competent for consent with normal mood and affect Lymphatic: No axillary or cervical lymphadenopathy  MUSCULOSKELETAL: LUE - severe swelling - cellulitis of arm - no definable abscess or fluctuance - NVI distally - TTP - not c/w compartment syndrome  Assessment: LUE swelling and cellulitis r/o abscess  Plan: - agree with MRI - keep NPO - may need surgery later today - agree with IV abx  Thank you for the consult and the opportunity to see Mr. Foye Clockance  N. Glee ArvinMichael Keviana Guida, MD [facility] [phone] 8:07 AM

## 2013-03-12 ENCOUNTER — Inpatient Hospital Stay (HOSPITAL_COMMUNITY): Payer: Self-pay | Admitting: Anesthesiology

## 2013-03-12 ENCOUNTER — Encounter (HOSPITAL_COMMUNITY)
Admission: EM | Disposition: A | Discharge status: Home / Self Care | Payer: Self-pay | Source: Home / Self Care | Attending: Internal Medicine

## 2013-03-12 ENCOUNTER — Encounter (HOSPITAL_COMMUNITY): Payer: Self-pay | Admitting: Anesthesiology

## 2013-03-12 ENCOUNTER — Inpatient Hospital Stay (HOSPITAL_COMMUNITY): Payer: Self-pay

## 2013-03-12 DIAGNOSIS — E1165 Type 2 diabetes mellitus with hyperglycemia: Secondary | ICD-10-CM

## 2013-03-12 DIAGNOSIS — A419 Sepsis, unspecified organism: Secondary | ICD-10-CM

## 2013-03-12 DIAGNOSIS — IMO0001 Reserved for inherently not codable concepts without codable children: Secondary | ICD-10-CM

## 2013-03-12 HISTORY — PX: I & D EXTREMITY: SHX5045

## 2013-03-12 LAB — COMPREHENSIVE METABOLIC PANEL
ALT: 32 U/L (ref 0–53)
AST: 33 U/L (ref 0–37)
Albumin: 2.1 g/dL — ABNORMAL LOW (ref 3.5–5.2)
Alkaline Phosphatase: 259 U/L — ABNORMAL HIGH (ref 39–117)
BILIRUBIN TOTAL: 0.2 mg/dL — AB (ref 0.3–1.2)
BUN: 11 mg/dL (ref 6–23)
CHLORIDE: 99 meq/L (ref 96–112)
CO2: 24 mEq/L (ref 19–32)
CREATININE: 0.63 mg/dL (ref 0.50–1.35)
Calcium: 8.1 mg/dL — ABNORMAL LOW (ref 8.4–10.5)
GFR calc Af Amer: >90 mL/min (ref >90)
GFR calc non Af Amer: >90 mL/min (ref >90)
Glucose, Bld: 148 mg/dL — ABNORMAL HIGH (ref 70–99)
Potassium: 3.6 mEq/L — ABNORMAL LOW (ref 3.7–5.3)
Sodium: 138 mEq/L (ref 137–147)
Total Protein: 6.4 g/dL (ref 6.0–8.3)

## 2013-03-12 LAB — GRAM STAIN

## 2013-03-12 LAB — CBC
HCT: 37 % — ABNORMAL LOW (ref 39.0–52.0)
Hemoglobin: 12.8 g/dL — ABNORMAL LOW (ref 13.0–17.0)
MCH: 28.8 pg (ref 26.0–34.0)
MCHC: 34.6 g/dL (ref 30.0–36.0)
MCV: 83.1 fL (ref 78.0–100.0)
PLATELETS: 347 10*3/uL (ref 150–400)
RBC: 4.45 MIL/uL (ref 4.22–5.81)
RDW: 13.2 % (ref 11.5–15.5)
WBC: 30.9 10*3/uL — AB (ref 4.0–10.5)

## 2013-03-12 LAB — GLUCOSE, CAPILLARY
GLUCOSE-CAPILLARY: 156 mg/dL — AB (ref 70–99)
Glucose-Capillary: 139 mg/dL — ABNORMAL HIGH (ref 70–99)
Glucose-Capillary: 159 mg/dL — ABNORMAL HIGH (ref 70–99)
Glucose-Capillary: 166 mg/dL — ABNORMAL HIGH (ref 70–99)
Glucose-Capillary: 188 mg/dL — ABNORMAL HIGH (ref 70–99)
Glucose-Capillary: 197 mg/dL — ABNORMAL HIGH (ref 70–99)

## 2013-03-12 SURGERY — IRRIGATION AND DEBRIDEMENT EXTREMITY
Anesthesia: General | Site: Arm Upper | Laterality: Bilateral

## 2013-03-12 MED ORDER — ROCURONIUM BROMIDE 50 MG/5ML IV SOLN
INTRAVENOUS | Status: AC
  Filled 2013-03-12: qty 1

## 2013-03-12 MED ORDER — OXYCODONE HCL 5 MG PO TABS
5.0000 mg | ORAL_TABLET | Freq: Once | ORAL | Status: AC | PRN
Start: 2013-03-12 — End: 2013-03-12
  Administered 2013-03-12: 5 mg via ORAL

## 2013-03-12 MED ORDER — PROPOFOL 10 MG/ML IV BOLUS
INTRAVENOUS | Status: DC | PRN
  Administered 2013-03-12: 50 mg via INTRAVENOUS
  Administered 2013-03-12: 200 mg via INTRAVENOUS

## 2013-03-12 MED ORDER — ONDANSETRON HCL 4 MG/2ML IJ SOLN
INTRAMUSCULAR | Status: AC
  Filled 2013-03-12: qty 2

## 2013-03-12 MED ORDER — HYDROMORPHONE HCL PF 1 MG/ML IJ SOLN
INTRAMUSCULAR | Status: AC
  Filled 2013-03-12: qty 2

## 2013-03-12 MED ORDER — OXYCODONE HCL 5 MG PO TABS
ORAL_TABLET | ORAL | Status: AC
  Filled 2013-03-12: qty 1

## 2013-03-12 MED ORDER — LACTATED RINGERS IV SOLN
INTRAVENOUS | Status: DC | PRN
  Administered 2013-03-12 (×2): via INTRAVENOUS

## 2013-03-12 MED ORDER — GADOBENATE DIMEGLUMINE 529 MG/ML IV SOLN
20.0000 mL | Freq: Once | INTRAVENOUS | Status: AC | PRN
  Administered 2013-03-12: 20 mL via INTRAVENOUS

## 2013-03-12 MED ORDER — LIDOCAINE HCL (CARDIAC) 20 MG/ML IV SOLN
INTRAVENOUS | Status: AC
  Filled 2013-03-12: qty 5

## 2013-03-12 MED ORDER — LABETALOL HCL 5 MG/ML IV SOLN
INTRAVENOUS | Status: DC | PRN
  Administered 2013-03-12: 5 mg via INTRAVENOUS

## 2013-03-12 MED ORDER — DIPHENHYDRAMINE HCL 12.5 MG/5ML PO ELIX
25.0000 mg | ORAL_SOLUTION | ORAL | Status: DC | PRN
  Administered 2013-03-16: 25 mg via ORAL
  Filled 2013-03-12 (×2): qty 10

## 2013-03-12 MED ORDER — POTASSIUM CHLORIDE CRYS ER 20 MEQ PO TBCR
40.0000 meq | EXTENDED_RELEASE_TABLET | Freq: Once | ORAL | Status: AC
  Administered 2013-03-12: 40 meq via ORAL
  Filled 2013-03-12: qty 2

## 2013-03-12 MED ORDER — SODIUM CHLORIDE 0.9 % IJ SOLN
INTRAMUSCULAR | Status: AC
  Filled 2013-03-12: qty 3

## 2013-03-12 MED ORDER — PROPOFOL 10 MG/ML IV BOLUS
INTRAVENOUS | Status: AC
  Filled 2013-03-12: qty 20

## 2013-03-12 MED ORDER — FAMOTIDINE 20 MG PO TABS
20.0000 mg | ORAL_TABLET | Freq: Two times a day (BID) | ORAL | Status: DC
  Administered 2013-03-12 – 2013-03-17 (×10): 20 mg via ORAL
  Filled 2013-03-12 (×13): qty 1

## 2013-03-12 MED ORDER — SODIUM CHLORIDE 0.9 % IR SOLN
Status: DC | PRN
  Administered 2013-03-12: 1000 mL

## 2013-03-12 MED ORDER — HYDROMORPHONE HCL PF 1 MG/ML IJ SOLN
0.2500 mg | INTRAMUSCULAR | Status: AC | PRN
  Administered 2013-03-12 (×8): 0.5 mg via INTRAVENOUS

## 2013-03-12 MED ORDER — PHENYLEPHRINE HCL 10 MG/ML IJ SOLN
INTRAMUSCULAR | Status: DC | PRN
  Administered 2013-03-12 (×2): 80 ug via INTRAVENOUS

## 2013-03-12 MED ORDER — FENTANYL CITRATE 0.05 MG/ML IJ SOLN
INTRAMUSCULAR | Status: AC
  Filled 2013-03-12: qty 5

## 2013-03-12 MED ORDER — HYDROMORPHONE HCL PF 1 MG/ML IJ SOLN: 0.2500 mg | INTRAMUSCULAR | Status: DC | PRN

## 2013-03-12 MED ORDER — ONDANSETRON HCL 4 MG/2ML IJ SOLN
INTRAMUSCULAR | Status: DC | PRN
  Administered 2013-03-12: 4 mg via INTRAVENOUS

## 2013-03-12 MED ORDER — FENTANYL CITRATE 0.05 MG/ML IJ SOLN
INTRAMUSCULAR | Status: DC | PRN
  Administered 2013-03-12 (×3): 50 ug via INTRAVENOUS
  Administered 2013-03-12: 100 ug via INTRAVENOUS
  Administered 2013-03-12: 50 ug via INTRAVENOUS
  Administered 2013-03-12 (×2): 100 ug via INTRAVENOUS
  Administered 2013-03-12: 50 ug via INTRAVENOUS
  Administered 2013-03-12 (×2): 100 ug via INTRAVENOUS
  Administered 2013-03-12 (×4): 50 ug via INTRAVENOUS
  Administered 2013-03-12: 100 ug via INTRAVENOUS
  Administered 2013-03-12: 50 ug via INTRAVENOUS
  Administered 2013-03-12: 100 ug via INTRAVENOUS
  Administered 2013-03-12: 50 ug via INTRAVENOUS

## 2013-03-12 MED ORDER — MIDAZOLAM HCL 2 MG/2ML IJ SOLN
INTRAMUSCULAR | Status: AC
  Filled 2013-03-12: qty 2

## 2013-03-12 MED ORDER — ONDANSETRON HCL 4 MG/2ML IJ SOLN: 4.0000 mg | Freq: Four times a day (QID) | INTRAMUSCULAR | Status: DC | PRN

## 2013-03-12 MED ORDER — OXYCODONE HCL 5 MG PO TABS
5.0000 mg | ORAL_TABLET | ORAL | Status: DC | PRN
  Administered 2013-03-13 – 2013-03-17 (×13): 10 mg via ORAL
  Filled 2013-03-12 (×14): qty 2

## 2013-03-12 MED ORDER — OXYCODONE HCL 5 MG/5ML PO SOLN: 5.0000 mg | Freq: Once | ORAL | Status: AC | PRN

## 2013-03-12 MED ORDER — ADULT MULTIVITAMIN W/MINERALS CH
1.0000 | ORAL_TABLET | Freq: Every day | ORAL | Status: DC
  Administered 2013-03-12 – 2013-03-17 (×6): 1 via ORAL
  Filled 2013-03-12 (×6): qty 1

## 2013-03-12 MED ORDER — 0.9 % SODIUM CHLORIDE (POUR BTL) OPTIME
TOPICAL | Status: DC | PRN
  Administered 2013-03-12: 1000 mL

## 2013-03-12 MED ORDER — HYDROCODONE-ACETAMINOPHEN 5-325 MG PO TABS
1.0000 | ORAL_TABLET | ORAL | Status: DC | PRN
  Administered 2013-03-13: 2 via ORAL
  Filled 2013-03-12 (×3): qty 2

## 2013-03-12 MED ORDER — MIDAZOLAM HCL 5 MG/5ML IJ SOLN
INTRAMUSCULAR | Status: DC | PRN
  Administered 2013-03-12: 2 mg via INTRAVENOUS

## 2013-03-12 MED ORDER — MORPHINE SULFATE 2 MG/ML IJ SOLN: 1.0000 mg | INTRAMUSCULAR | Status: DC | PRN

## 2013-03-12 MED ORDER — LABETALOL HCL 5 MG/ML IV SOLN
INTRAVENOUS | Status: AC
  Filled 2013-03-12: qty 4

## 2013-03-12 SURGICAL SUPPLY — 61 items
BANDAGE CONFORM 3  STR LF (GAUZE/BANDAGES/DRESSINGS) IMPLANT
BANDAGE ELASTIC 3 VELCRO ST LF (GAUZE/BANDAGES/DRESSINGS) ×3 IMPLANT
BANDAGE ELASTIC 4 VELCRO ST LF (GAUZE/BANDAGES/DRESSINGS) ×9 IMPLANT
BANDAGE GAUZE ELAST BULKY 4 IN (GAUZE/BANDAGES/DRESSINGS) ×15 IMPLANT
BLADE SURG 10 STRL SS (BLADE) ×3 IMPLANT
BNDG COHESIVE 1X5 TAN STRL LF (GAUZE/BANDAGES/DRESSINGS) IMPLANT
BNDG COHESIVE 4X5 TAN STRL (GAUZE/BANDAGES/DRESSINGS) IMPLANT
BNDG COHESIVE 6X5 TAN STRL LF (GAUZE/BANDAGES/DRESSINGS) IMPLANT
BNDG GAUZE STRTCH 6 (GAUZE/BANDAGES/DRESSINGS) IMPLANT
CLOTH BEACON ORANGE TIMEOUT ST (SAFETY) ×3 IMPLANT
CORDS BIPOLAR (ELECTRODE) IMPLANT
COVER SURGICAL LIGHT HANDLE (MISCELLANEOUS) ×3 IMPLANT
CUFF TOURNIQUET SINGLE 18IN (TOURNIQUET CUFF) ×3 IMPLANT
CUFF TOURNIQUET SINGLE 24IN (TOURNIQUET CUFF) IMPLANT
CUFF TOURNIQUET SINGLE 34IN LL (TOURNIQUET CUFF) IMPLANT
CUFF TOURNIQUET SINGLE 44IN (TOURNIQUET CUFF) IMPLANT
DRAPE ORTHO SPLIT 77X108 STRL (DRAPES)
DRAPE SURG 17X23 STRL (DRAPES) IMPLANT
DRAPE SURG ORHT 6 SPLT 77X108 (DRAPES) IMPLANT
DRAPE U-SHAPE 47X51 STRL (DRAPES) ×3 IMPLANT
DRSG ADAPTIC 3X8 NADH LF (GAUZE/BANDAGES/DRESSINGS) ×6 IMPLANT
DURAPREP 26ML APPLICATOR (WOUND CARE) ×3 IMPLANT
ELECT CAUTERY BLADE 6.4 (BLADE) ×6 IMPLANT
ELECT REM PT RETURN 9FT ADLT (ELECTROSURGICAL)
ELECTRODE REM PT RTRN 9FT ADLT (ELECTROSURGICAL) IMPLANT
FACESHIELD LNG OPTICON STERILE (SAFETY) IMPLANT
GAUZE XEROFORM 1X8 LF (GAUZE/BANDAGES/DRESSINGS) IMPLANT
GLOVE SURG SS PI 7.5 STRL IVOR (GLOVE) ×6 IMPLANT
GOWN STRL NON-REIN LRG LVL3 (GOWN DISPOSABLE) ×3 IMPLANT
GOWN STRL REIN XL XLG (GOWN DISPOSABLE) ×3 IMPLANT
HANDPIECE INTERPULSE COAX TIP (DISPOSABLE)
KIT BASIN OR (CUSTOM PROCEDURE TRAY) ×3 IMPLANT
KIT ROOM TURNOVER OR (KITS) ×3 IMPLANT
MANIFOLD NEPTUNE II (INSTRUMENTS) ×3 IMPLANT
NS IRRIG 1000ML POUR BTL (IV SOLUTION) ×3 IMPLANT
PACK ORTHO EXTREMITY (CUSTOM PROCEDURE TRAY) ×3 IMPLANT
PAD ABD 8X10 STRL (GAUZE/BANDAGES/DRESSINGS) ×18 IMPLANT
PAD ARMBOARD 7.5X6 YLW CONV (MISCELLANEOUS) ×6 IMPLANT
PADDING CAST ABS 4INX4YD NS (CAST SUPPLIES) ×4
PADDING CAST ABS COTTON 4X4 ST (CAST SUPPLIES) ×2 IMPLANT
PADDING CAST COTTON 6X4 STRL (CAST SUPPLIES) ×3 IMPLANT
PENCIL BUTTON HOLSTER BLD 10FT (ELECTRODE) ×3 IMPLANT
SET HNDPC FAN SPRY TIP SCT (DISPOSABLE) IMPLANT
SPONGE GAUZE 4X4 12PLY (GAUZE/BANDAGES/DRESSINGS) ×6 IMPLANT
SPONGE LAP 18X18 X RAY DECT (DISPOSABLE) ×18 IMPLANT
STOCKINETTE IMPERVIOUS 9X36 MD (GAUZE/BANDAGES/DRESSINGS) IMPLANT
SUT ETHILON 2 0 FS 18 (SUTURE) ×36 IMPLANT
SUT ETHILON 3 0 PS 1 (SUTURE) ×6 IMPLANT
SUT VIC AB 2-0 FS1 27 (SUTURE) ×3 IMPLANT
SWAB COLLECTION DEVICE MRSA (MISCELLANEOUS) ×6 IMPLANT
SYR CONTROL 10ML LL (SYRINGE) IMPLANT
TOWEL OR 17X24 6PK STRL BLUE (TOWEL DISPOSABLE) ×3 IMPLANT
TOWEL OR 17X26 10 PK STRL BLUE (TOWEL DISPOSABLE) ×3 IMPLANT
TUBE ANAEROBIC SPECIMEN COL (MISCELLANEOUS) ×6 IMPLANT
TUBE CONNECTING 12'X1/4 (SUCTIONS) ×2
TUBE CONNECTING 12X1/4 (SUCTIONS) ×4 IMPLANT
TUBE FEEDING 5FR 15 INCH (TUBING) IMPLANT
TUBING CYSTO DISP (UROLOGICAL SUPPLIES) ×6 IMPLANT
UNDERPAD 30X30 INCONTINENT (UNDERPADS AND DIAPERS) ×9 IMPLANT
WATER STERILE IRR 1000ML POUR (IV SOLUTION) ×6 IMPLANT
YANKAUER SUCT BULB TIP NO VENT (SUCTIONS) ×6 IMPLANT

## 2013-03-12 NOTE — Progress Notes (Signed)
   Subjective:  Patient reports as unchanged.  No events overnight.  Objective:   VITALS:   Filed Vitals:   03/11/13 2034 03/11/13 2337 03/12/13 0451 03/12/13 0753  BP: 144/76 139/81 125/73 143/76  Pulse: 105   110  Temp: 99.4 F (37.4 C) 98.6 F (37 C) 98.4 F (36.9 C) 99.8 F (37.7 C)  TempSrc: Oral Oral Oral Oral  Resp: 23   36  Height:      Weight:      SpO2: 96% 100% 99% 95%   Severe swelling of LUE with cellulitis Induration of the antecubital fossa and proximal forearm Upper arm soft with cellulitis  Small RUE antecubital fossa abscess with fluctuance   Lab Results  Component Value Date   WBC 30.9* 03/12/2013   HGB 12.8* 03/12/2013   HCT 37.0* 03/12/2013   MCV 83.1 03/12/2013   PLT 347 03/12/2013     Assessment/Plan:     Problem List Items Addressed This Visit     Musculoskeletal and Integument   Cellulitis and abscess   Relevant Medications      vancomycin (VANCOCIN) 500 MG powder (Completed)      vancomycin (VANCOCIN) IVPB 1000 mg/200 mL premix    Other Visit Diagnoses   Cellulitis    -  Primary    Type II or unspecified type diabetes mellitus without mention of complication, not stated as uncontrolled        Relevant Medications       metFORMIN (GLUCOPHAGE) 1000 MG tablet       insulin detemir (LEVEMIR) injection 10 Units       insulin aspart (novoLOG) injection 0-15 Units    SIRS (systemic inflammatory response syndrome)           - NPO - plan for surgery this pm - continue abx   Cheral AlmasXu, Naiping Michael 03/12/2013, 7:58 AM [phone]

## 2013-03-12 NOTE — H&P (Signed)
H&P update  The surgical history has been reviewed and remains accurate without interval change.  The patient was re-examined and patient's physiologic condition has not changed significantly in the last 30 days. The condition still exists that makes this procedure necessary. The treatment plan remains the same, without new options for care.  No new pharmacological allergies or types of therapy has been initiated that would change the plan or the appropriateness of the plan.  The patient and/or family understand the potential benefits and risks.  N. Michael Xu, MD 03/12/2013 11:03 AM   

## 2013-03-12 NOTE — Anesthesia Procedure Notes (Signed)
Procedure Name: LMA Insertion Date/Time: 03/12/2013 4:45 PM Performed by: Sarita HaverFLOWERS, Zuha Dejonge T Pre-anesthesia Checklist: Patient identified, Timeout performed, Emergency Drugs available, Suction available and Patient being monitored Patient Re-evaluated:Patient Re-evaluated prior to inductionOxygen Delivery Method: Circle system utilized and Simple face mask Preoxygenation: Pre-oxygenation with 100% oxygen Intubation Type: IV induction LMA: LMA inserted LMA Size: 5.0 Number of attempts: 1 Airway Equipment and Method: Patient positioned with wedge pillow Tube secured with: Tape Dental Injury: Teeth and Oropharynx as per pre-operative assessment

## 2013-03-12 NOTE — Transfer of Care (Signed)
Immediate Anesthesia Transfer of Care Note  Patient: Jay Snyder  Procedure(s) Performed: Procedure(s): IRRIGATION AND DEBRIDEMENT EXTREMITY BILATERAL FOREARM ABSCESSES (Bilateral)  Patient Location: PACU  Anesthesia Type:General  Level of Consciousness: awake, alert  and oriented  Airway & Oxygen Therapy: Patient Spontanous Breathing and Patient connected to face mask oxygen  Post-op Assessment: Report given to PACU RN, Post -op Vital signs reviewed and stable and Patient moving all extremities X 4  Post vital signs: Reviewed and stable  Complications: No apparent anesthesia complications

## 2013-03-12 NOTE — Progress Notes (Signed)
[facility]  Jay Snyder ZOX:[id number] DOB: Sep 16, 1980 DOA: 03/10/2013 PCP: No primary provider on file.  Brief narrative: 33 year old male patient known diabetic on metformin at home. Presented with a 3-4 day history of severe left arm swelling. Patient developed the symptoms after injecting cocaine into his arm. Also endorsed fever up to 102.2.   Assessment/Plan:    Sepsis -Source upper extremity cellulitis with abscesses -BP stable and actually hypertensive but remains tachycardic and tachypneic -Continue empiric treatment with antibiotics and other supportive care    Cellulitis and multifocal abscesses / Edema of upper extremity -Some swelling right upper extremity but primarily left upper extremity -MRI revealed abscesses extending from the antecubital region primarily involving the pronator teres muscle as well as myositis of the pronator teres, supinator, brachial radialis, proximal extensor musculature and to a lesser extent flexor carpi radialis as well as confirmed extensive cellulitis and forearm. Given these findings the orthopedic surgeon plans on operative procedure this afternoon -Monitor closely for signs of impending compartment syndrome -Treat pain with Tylenol, morphine, oxycodone -Continue scheduled low-dose IV Toradol every 8 hours for 6 doses only-use twice a day Pepcid for GI protection-last dosing for Toradol will be 03/13/2013-reevaluate if needed postoperatively -Unclear if true IV drug abuse or skin popping but as precaution since only one blood culture was obtained in the ER we obtained another set    Limited IV access -likely due to IVD use + sepsis - unable to obtain R arm PICC today due to fear of local cellulitis  - PCCM placed CVL 2/2 -was dosed w/ oral levaquin (broad coverage in a diabetic) until IV abx able to be given     Diabetes mellitus type 2, uncontrolled -Began low-dose Levemir with moderate sliding scale  insulin 2/2 -Hemoglobin A1c 9.8    Sinus tachycardia -does not appear to be dehydrated and seems to be more of a marker of acute inflammatory response and sepsis -IV fluids while n.p.o.  DVT prophylaxis: SCDs-heparin versus Lovenox postoperatively at discretion of orthopedic physician Code Status: Full Family Communication: Family member at bedside Disposition Plan/Expected LOS: Remain in step down  Consultants: Orthopedics  Procedures: None  Antibiotics: Zosyn 2/1 >>> Vancomycin 2/1 >>> Oral Levaquin 2/2 >>  HPI/Subjective: Patient alert and was continued malaise and pain in left arm although he feels pain and swelling a little better.  Objective: Blood pressure 153/88, pulse 108, temperature 99.8 F (37.7 C), temperature source Oral, resp. rate 39, height 5\' 11"  (1.803 m), weight 232 lb 12.9 oz (105.6 kg), SpO2 95.00%.  Intake/Output Summary (Last 24 hours) at 03/12/13 1127 Last data filed at 03/12/13 0800  Gross per 24 hour  Intake 1342.42 ml  Output    400 ml  Net 942.42 ml   Exam: General: No acute respiratory distress but has resting tachypnea-somewhat toxic appearing Lungs: Clear to auscultation bilaterally without wheezes or crackles, RA Cardiovascular: Regular tachycardic rate (less than 110 beats per minute) and rhythm without murmur gallop or rub normal S1 and S2, no peripheral edema or JVD Abdomen: Nontender, nondistended, soft, bowel sounds positive, no rebound, no ascites, no appreciable mass Musculoskeletal: No significant cyanosis, clubbing of bilateral lower extremities - L arm markedly swollen but no paralysis or palor and pt denies paresthesias-left arm elevated with sling device Neurological: Alert and oriented x 3, moves all extremities x 4 without focal neurological deficits, CN 2-12 intact  Scheduled Meds:  Scheduled Meds: . docusate sodium  100 mg  Oral BID  . famotidine  20 mg Oral BID  . insulin aspart  0-15 Units Subcutaneous Q4H  . insulin  detemir  10 Units Subcutaneous Daily  . levofloxacin  750 mg Oral Daily  . pantoprazole  40 mg Oral Q1200  . piperacillin-tazobactam (ZOSYN)  IV  3.375 g Intravenous Q8H  . piperacillin-tazobactam  4.5 g Intravenous Once  . sodium chloride  3 mL Intravenous Q12H  . vancomycin  1,000 mg Intravenous Q12H    Data Reviewed: Basic Metabolic Panel:  Recent Labs Lab 03/10/13 1810 03/11/13 0130 03/12/13 0440  NA 134* 132* 138  K 4.0 3.5* 3.6*  CL 95* 95* 99  CO2 22 22 24   GLUCOSE 491* 192* 148*  BUN 17 11 11   CREATININE 0.80 0.64 0.63  CALCIUM 9.7 8.8 8.1*  MG  --  1.7  --   PHOS  --  2.3  --    Liver Function Tests:  Recent Labs Lab 03/11/13 0130 03/12/13 0440  AST 34 33  ALT 31 32  ALKPHOS 181* 259*  BILITOT 0.2* 0.2*  PROT 6.5 6.4  ALBUMIN 2.1* 2.1*   CBC:  Recent Labs Lab 03/10/13 1810 03/11/13 0130 03/12/13 0440  WBC 33.0* 27.9* 30.9*  NEUTROABS 29.0*  --   --   HGB 13.9 13.4 12.8*  HCT 41.1 38.3* 37.0*  MCV 83.5 82.4 83.1  PLT 302 281 347   CBG:  Recent Labs Lab 03/11/13 1616 03/11/13 2038 03/11/13 2340 03/12/13 0454 03/12/13 0752  GLUCAP 118* 211* 139* 156* 166*    Recent Results (from the past 240 hour(s))  CULTURE, BLOOD (SINGLE)     Status: None   Collection Time    03/10/13  6:10 PM      Result Value Range Status   Specimen Description BLOOD RIGHT ARM   Final   Special Requests BOTTLES DRAWN AEROBIC AND ANAEROBIC 5CC EACH   Final   Culture  Setup Time     Final   Value: 03/11/2013 02:15     Performed at [facility]   Culture     Final   Value:        BLOOD CULTURE RECEIVED NO GROWTH TO DATE CULTURE WILL BE HELD FOR 5 DAYS BEFORE ISSUING A FINAL NEGATIVE REPORT     Performed at [facility]   Report Status PENDING   Incomplete  MRSA PCR SCREENING     Status: None   Collection Time    03/10/13 11:07 PM      Result Value Range Status   MRSA by PCR NEGATIVE  NEGATIVE Final   Comment:            The GeneXpert MRSA  Assay (FDA     approved for NASAL specimens     only), is one component of a     comprehensive MRSA colonization     surveillance program. It is not     intended to diagnose MRSA     infection nor to guide or     monitor treatment for     MRSA infections.  CULTURE, BLOOD (ROUTINE X 2)     Status: None   Collection Time    03/11/13  3:10 PM      Result Value Range Status   Specimen Description BLOOD RIGHT HAND   Final   Special Requests BOTTLES DRAWN AEROBIC ONLY 3CC   Final   Culture  Setup Time     Final   Value: 03/11/2013  21:41     Performed at [facility]     Final   Value:        BLOOD CULTURE RECEIVED NO GROWTH TO DATE CULTURE WILL BE HELD FOR 5 DAYS BEFORE ISSUING A FINAL NEGATIVE REPORT     Performed at [facility]   Report Status PENDING   Incomplete  CULTURE, BLOOD (ROUTINE X 2)     Status: None   Collection Time    03/11/13  4:05 PM      Result Value Range Status   Specimen Description BLOOD RIGHT HAND   Final   Special Requests BOTTLES DRAWN AEROBIC ONLY 4CC   Final   Culture  Setup Time     Final   Value: 03/11/2013 21:40     Performed at [facility]   Culture     Final   Value:        BLOOD CULTURE RECEIVED NO GROWTH TO DATE CULTURE WILL BE HELD FOR 5 DAYS BEFORE ISSUING A FINAL NEGATIVE REPORT     Performed at [facility]   Report Status PENDING   Incomplete     Studies:  Recent x-ray studies have been reviewed in detail by the Attending Physician  Time spent :      Junious Silk, ANP [facility] Office  [phone] Pager [phone]  **If unable to reach the above provider after paging please contact the Flow Manager @ [phone]  On-Call/Text Page:      [email or web address]      password [email or web address]  If 7PM-7AM, please contact night-coverage [email or web address] Password [email or web address] 03/12/2013, 11:27 AM   LOS: 2 days    I have examined the patient, reviewed the chart and modified the above note which I agree  with.   Joh Rao,MD [phone] 03/12/2013, 5:02 PM

## 2013-03-12 NOTE — Op Note (Addendum)
Date of surgery: 03/12/2013  Preoperative diagnosis: 1. Left biceps, brachialis, flexor pronator mass abscesses 2. right antecubital fossa abscess  Postoperative diagnosis: Same  Procedure: 1. Incision and drainage of left upper extremity abscesses. 2. Debridement of muscle, and soft tissue, skin of left arm. 10 x 5 cm 3. Incision and drainage of right antecubital fossa abscess 4. Debridement of muscle, soft tissue, skin of right arm. 5 x 5 cm  Surgeon: 1. Jay Snyder, M.D. 2. Jay Snyder, M.D.  Assistant: Jay CanalGilbert Clark, PA-C  Estimated blood loss: 200 cc  Complications: None  Condition to PACU: Stable  Indication for procedure: Jay Snyder is a 33 year old gentleman who has a history of IV drug use. Jay Snyder recently injected cocaine into his bilateral antecubital fossa and developed severe cellulitis swelling and pain of the left upper extremity and a small antecubital fossa abscess on the right side. Jay Snyder presented to the was [place] with these symptoms. Orthopedics was consulted in the recommendation was for surgical treatment. The risks, benefits, and alternatives to surgery were discussed with the patient and Jay Snyder wished to proceed with surgery.  Description of procedure: The patient was identified in the preoperative holding area. The operative sites were confirmed with the patient and marked by the surgeon. Jay Snyder is brought back to the operating room. His placed supine on the operating table. General anesthesia was induced. The left upper extremity was prepped and draped in standard sterile fashion. Regularly scheduled antibiotics were given at the scheduled intervals. A timeout was performed. A longitudinal curvilinear incision starting at the lateral aspect of the arm was brought down and into the antecubital fossa and then curved down ulnarly the forearm. A was a large amount of frank pus that was expelled from the wound. 2 cultures were taken. Blunt dissection was taken down to the  level of the muscular fascia. Blunt dissection was then used to expose the interval between the biceps and brachialis.. Once this was developed a large amount of frank pus was again expressed. In the form the flexor pronator mass was also exposed blunt dissection was used to decompress the multiple abscesses in this area. Sharp excisional debridement of the muscle soft tissue and skin carried out using the skin and rongeur. Once adequate debridement was completed we irrigated the wounds out with 9 L of normal saline using cystoscopy tubing. Hemostasis was obtained the wound closed loosely with interrupted 2-0 nylon sutures. A sterile dressing was applied. We then turned our attention to the right arm. The right upper extremity was prepped and draped in standard sterile fashion. A timeout was performed. An incision based in the antecubital fossa was used. Frank pus was expelled once we got into the area of fluctuance 2 cultures were then taken again. Sharp excisional debridement of the skin muscle soft tissue was carried out using skin and rongeur. 6 L of normal saline was irrigated through the wound. The wound was probed for extension into the deeper tissues.  The wound was then closed loosely with interrupted 2-0 nylon sutures. A sterile dressing was applied. The patient awoke from anesthesia uneventfully and was transferred to the PACU in stable condition. Next  Disposition: The patient will remain on antibiotics while the cultures are growing. We will follow him for a clinical improvement or worsening. If the patient fails to respond appropriately to the surgical intervention Jay Snyder may be indicated for a repeat look. Jay Snyder  Jay ReelN. Michael Xu, MD [facility] [phone] 6:52 PM

## 2013-03-12 NOTE — Anesthesia Preprocedure Evaluation (Signed)
Anesthesia Evaluation  Patient identified by MRN, date of birth, ID band Patient awake    Reviewed: Allergy & Precautions, H&P , NPO status , Patient's Chart, lab work & pertinent test results  Airway Mallampati: II  Neck ROM: full    Dental   Pulmonary Current Smoker,          Cardiovascular negative cardio ROS      Neuro/Psych    GI/Hepatic (+)     substance abuse  cocaine use and IV drug use,   Endo/Other  diabetes, Type 2  Renal/GU      Musculoskeletal   Abdominal   Peds  Hematology   Anesthesia Other Findings   Reproductive/Obstetrics                           Anesthesia Physical Anesthesia Plan  ASA: II  Anesthesia Plan: General   Post-op Pain Management:    Induction: Intravenous  Airway Management Planned: LMA  Additional Equipment:   Intra-op Plan:   Post-operative Plan:   Informed Consent: I have reviewed the patients History and Physical, chart, labs and discussed the procedure including the risks, benefits and alternatives for the proposed anesthesia with the patient or authorized representative who has indicated his/her understanding and acceptance.     Plan Discussed with: CRNA, Anesthesiologist and Surgeon  Anesthesia Plan Comments:         Anesthesia Quick Evaluation

## 2013-03-12 NOTE — Anesthesia Postprocedure Evaluation (Signed)
  Anesthesia Post-op Note  Patient: Phylliss BobJeremy Fetty  Procedure(s) Performed: Procedure(s): IRRIGATION AND DEBRIDEMENT EXTREMITY BILATERAL FOREARM ABSCESSES (Bilateral)  Patient Location: PACU  Anesthesia Type:General  Level of Consciousness: awake alert oriented  Airway and Oxygen Therapy: Patient Spontanous Breathing  Post-op Pain: mild  Post-op Assessment: Post-op Vital signs reviewed  Post-op Vital Signs: Reviewed  Complications: No apparent anesthesia complications

## 2013-03-13 LAB — CBC
HCT: 30.7 % — ABNORMAL LOW (ref 39.0–52.0)
Hemoglobin: 10.4 g/dL — ABNORMAL LOW (ref 13.0–17.0)
MCH: 28 pg (ref 26.0–34.0)
MCHC: 33.9 g/dL (ref 30.0–36.0)
MCV: 82.7 fL (ref 78.0–100.0)
PLATELETS: 392 10*3/uL (ref 150–400)
RBC: 3.71 MIL/uL — AB (ref 4.22–5.81)
RDW: 13.1 % (ref 11.5–15.5)
WBC: 27.4 10*3/uL — ABNORMAL HIGH (ref 4.0–10.5)

## 2013-03-13 LAB — BASIC METABOLIC PANEL
BUN: 9 mg/dL (ref 6–23)
CO2: 26 meq/L (ref 19–32)
CREATININE: 0.68 mg/dL (ref 0.50–1.35)
Calcium: 7.8 mg/dL — ABNORMAL LOW (ref 8.4–10.5)
Chloride: 99 mEq/L (ref 96–112)
GFR calc Af Amer: >90 mL/min (ref >90)
GFR calc non Af Amer: >90 mL/min (ref >90)
Glucose, Bld: 173 mg/dL — ABNORMAL HIGH (ref 70–99)
Potassium: 3.8 mEq/L (ref 3.7–5.3)
Sodium: 137 mEq/L (ref 137–147)

## 2013-03-13 LAB — GLUCOSE, CAPILLARY
GLUCOSE-CAPILLARY: 185 mg/dL — AB (ref 70–99)
GLUCOSE-CAPILLARY: 190 mg/dL — AB (ref 70–99)
GLUCOSE-CAPILLARY: 258 mg/dL — AB (ref 70–99)
Glucose-Capillary: 169 mg/dL — ABNORMAL HIGH (ref 70–99)
Glucose-Capillary: 209 mg/dL — ABNORMAL HIGH (ref 70–99)
Glucose-Capillary: 180 mg/dL — ABNORMAL HIGH (ref 70–99)

## 2013-03-13 MED ORDER — KETOROLAC TROMETHAMINE 15 MG/ML IJ SOLN
15.0000 mg | Freq: Four times a day (QID) | INTRAMUSCULAR | Status: DC | PRN
  Administered 2013-03-13: 15 mg via INTRAVENOUS
  Filled 2013-03-13: qty 1

## 2013-03-13 NOTE — Progress Notes (Signed)
ANTIBIOTIC CONSULT NOTE  Pharmacy Consult for Vancomycin Indication: cellulitis  No Known Allergies  Labs:  Recent Labs  03/10/13 1810 03/11/13 0130 03/12/13 0440 03/13/13 0856  WBC 33.0* 27.9* 30.9* 27.4*  HGB 13.9 13.4 12.8* 10.4*  PLT 302 281 347 392  CREATININE 0.80 0.64 0.63  --    Estimated Creatinine Clearance: 166.9 ml/min (by C-G formula based on Cr of 0.63).   Microbiology: Recent Results (from the past 720 hour(s))  CULTURE, BLOOD (SINGLE)     Status: None   Collection Time    03/10/13  6:10 PM      Result Value Range Status   Specimen Description BLOOD RIGHT ARM   Final   Special Requests BOTTLES DRAWN AEROBIC AND ANAEROBIC 5CC EACH   Final   Culture  Setup Time     Final   Value: 03/11/2013 02:15     Performed at [facility]   Culture     Final   Value:        BLOOD CULTURE RECEIVED NO GROWTH TO DATE CULTURE WILL BE HELD FOR 5 DAYS BEFORE ISSUING A FINAL NEGATIVE REPORT     Performed at [facility]   Report Status PENDING   Incomplete  MRSA PCR SCREENING     Status: None   Collection Time    03/10/13 11:07 PM      Result Value Range Status   MRSA by PCR NEGATIVE  NEGATIVE Final   Comment:            The GeneXpert MRSA Assay (FDA     approved for NASAL specimens     only), is one component of a     comprehensive MRSA colonization     surveillance program. It is not     intended to diagnose MRSA     infection nor to guide or     monitor treatment for     MRSA infections.  CULTURE, BLOOD (ROUTINE X 2)     Status: None   Collection Time    03/11/13  3:10 PM      Result Value Range Status   Specimen Description BLOOD RIGHT HAND   Final   Special Requests BOTTLES DRAWN AEROBIC ONLY 3CC   Final   Culture  Setup Time     Final   Value: 03/11/2013 21:41     Performed at [facility]   Culture     Final   Value:        BLOOD CULTURE RECEIVED NO GROWTH TO DATE CULTURE WILL BE HELD FOR 5 DAYS BEFORE ISSUING A FINAL NEGATIVE  REPORT     Performed at [facility]   Report Status PENDING   Incomplete  CULTURE, BLOOD (ROUTINE X 2)     Status: None   Collection Time    03/11/13  4:05 PM      Result Value Range Status   Specimen Description BLOOD RIGHT HAND   Final   Special Requests BOTTLES DRAWN AEROBIC ONLY 4CC   Final   Culture  Setup Time     Final   Value: 03/11/2013 21:40     Performed at [facility]   Culture     Final   Value:        BLOOD CULTURE RECEIVED NO GROWTH TO DATE CULTURE WILL BE HELD FOR 5 DAYS BEFORE ISSUING A FINAL NEGATIVE REPORT     Performed at [facility]   Report Status PENDING  Incomplete  GRAM STAIN     Status: None   Collection Time    03/12/13  5:20 PM      Result Value Range Status   Specimen Description ABSCESS LEFT ARM   Final   Special Requests DIFFUSE ARM ABSCESS   Final   Gram Stain     Final   Value: ABUNDANT WBC PRESENT,BOTH PMN AND MONONUCLEAR     FEW GRAM POSITIVE COCCI IN PAIRS IN CLUSTERS     Gram Stain Report Called to,Read Back By and Verified With: Gates Rigg 1914 03/12/13 A BROWNING   Report Status 03/12/2013 FINAL   Final  ANAEROBIC CULTURE     Status: None   Collection Time    03/12/13  5:20 PM      Result Value Range Status   Specimen Description ABSCESS LEFT ARM   Final   Special Requests NONE  DIFFUSE LEFT ARM ABSCESS   Final   Gram Stain     Final   Value: ABUNDANT WBC PRESENT,BOTH PMN AND MONONUCLEAR     RARE SQUAMOUS EPITHELIAL CELLS PRESENT     FEW GRAM POSITIVE COCCI     IN PAIRS IN CLUSTERS Performed at [facility]     Performed at [facility]   Culture     Final   Value: NO ANAEROBES ISOLATED     Note: Gram Stain Report Called to,Read Back By and Verified With: Gates Rigg 1914 03/12/13 A BROWNING     Performed at [facility]   Report Status PENDING   Incomplete  CULTURE, ROUTINE-ABSCESS     Status: None   Collection Time    03/12/13  5:20 PM      Result Value Range Status   Specimen  Description ABSCESS LEFT ARM   Final   Special Requests DIFFUSE ARM ABSCESS   Final   Gram Stain     Final   Value: ABUNDANT WBC PRESENT,BOTH PMN AND MONONUCLEAR     RARE SQUAMOUS EPITHELIAL CELLS PRESENT     FEW GRAM POSITIVE COCCI     IN PAIRS IN CLUSTERS Performed at [facility] Gram Stain Report Called to,Read Back By and Verified With: Gram Stain Report Called to,Read Back By and Verified With: Gates Rigg 1914 03/12/13 A BROWNING     Performed at [facility]   Culture     Final   Value: NO GROWTH 1 DAY     Performed at [facility]   Report Status PENDING   Incomplete  ANAEROBIC CULTURE     Status: None   Collection Time    03/12/13  6:34 PM      Result Value Range Status   Specimen Description ABSCESS FOREARM RIGHT   Final   Special Requests NONE   Final   Gram Stain PENDING   Incomplete   Culture     Final   Value: NO ANAEROBES ISOLATED; CULTURE IN PROGRESS FOR 5 DAYS     Performed at [facility]   Report Status PENDING   Incomplete    Assessment: 32yo male c/o swelling, redness, and pain of LUE that has worsened over 3d, uses IV drugs, continues on Vancomycin, Zosyn, and po Levaquin for cellulitis  Scr stable Now s/p I +D 2/3  Goal of Therapy:  Vancomycin trough level 10-15 mcg/ml  Plan:  1) Continue Vancomycin 1 Gram iv Q 12 hours 2) Continue Zosyn 4 hr infusion 3) Continue Levaquin 750 mg po daily  4) Continue to follow  Thank you. Okey RegalLisa Elim Peale, PharmD [phone] 03/13/2013,9:34 AM

## 2013-03-13 NOTE — Progress Notes (Signed)
Subjective:  Patient reports as slightly improved. No events overnight  Objective:   VITALS:   Filed Vitals:   03/13/13 0000 03/13/13 0200 03/13/13 0400 03/13/13 0800  BP: 163/88 159/98 145/78 158/82  Pulse: 103 106 111 117  Temp: 97.8 F (36.6 C)  98.5 F (36.9 C) 100 F (37.8 C)  TempSrc: Oral  Oral Oral  Resp: 24 24 23  34  Height:      Weight:      SpO2: 94% 97% 93% 97%   LUE: - NVI - expected postop swelling in hand - slightly improved pain  RUE: - NVI - minimal swelling or pain   Lab Results  Component Value Date   WBC 30.9* 03/12/2013   HGB 12.8* 03/12/2013   HCT 37.0* 03/12/2013   MCV 83.1 03/12/2013   PLT 347 03/12/2013     Assessment/Plan: 1 Day Post-Op   Problem List Items Addressed This Visit     Endocrine   Diabetes mellitus type 2, uncontrolled   Relevant Medications      metFORMIN (GLUCOPHAGE) 1000 MG tablet      insulin detemir (LEVEMIR) injection 10 Units      insulin aspart (novoLOG) injection 0-15 Units     Musculoskeletal and Integument   Cellulitis and abscess   Relevant Medications      vancomycin (VANCOCIN) 500 MG powder (Completed)      vancomycin (VANCOCIN) IVPB 1000 mg/200 mL premix      diphenhydrAMINE (BENADRYL) 12.5 MG/5ML elixir 25 mg     Other   Sepsis    Other Visit Diagnoses   Cellulitis    -  Primary    Type II or unspecified type diabetes mellitus without mention of complication, not stated as uncontrolled        SIRS (systemic inflammatory response syndrome)           - follow cx - agree with IV abx - will follow for clinical worsening - discussed with patient he may need repeat I&D for LUE if he fails to improve - arm sling elevator LUE at all times   Cheral AlmasXu, Naiping Michael 03/13/2013, 8:11 AM [phone]

## 2013-03-13 NOTE — Progress Notes (Signed)
Jay Snyder  Jay Snyder ZOX:[id number] DOB: [birth year] DOA: 03/10/2013 PCP: No primary provider on file.  Brief narrative: 33 year old male patient known diabetic on metformin at home. Presented with a 3-4 day history of severe left arm swelling. Patient developed the symptoms after injecting cocaine into his arm. Also endorsed fever up to 102.2.   Assessment/Plan:    Sepsis -Source upper extremity cellulitis with abscesses -BP stable and actually hypertensive but remains tachycardic and tachypneic -Continue empiric treatment with antibiotics and other supportive care    Cellulitis and multifocal abscesses / Edema of upper extremity -Some swelling right upper extremity but primarily left upper extremity -MRI revealed abscesses extending from the antecubital region primarily involving the pronator teres muscle as well as myositis of the pronator teres, supinator, brachial radialis, proximal extensor musculature and to a lesser extent flexor carpi radialis as well as confirmed extensive cellulitis and forearm. -now s/p I/D with muscle and soft tissue/skin debridement 2/3 -Treat pain with Tylenol, morphine, oxycodone and Toradol    Limited IV access/IVDA -likely due to IVD use + sepsis - unable to obtain R arm PICC due to fear of local cellulitis  -PCCM placed CVL 2/2 -pt counseled by attending MD re need complete cessation of illicit drugs    Diabetes mellitus type 2, uncontrolled -Began low-dose Levemir with moderate sliding scale insulin 2/2 -Hemoglobin A1c 9.8    Sinus tachycardia -does not appear to be dehydrated and seems to be more of a marker of acute inflammatory response and sepsis -IV fluids while n.p.o.  DVT prophylaxis: SCDs-heparin versus Lovenox postoperatively at discretion of orthopedic physician Code Status: Full Family Communication: Multiple family members at bedside Disposition Plan/Expected LOS: Remain in step  down  Consultants: Orthopedics  Procedures: 03/12/12 per Dr. Roda Shutters: 1. Incision and drainage of left upper extremity abscesses.  2. Debridement of muscle, and soft tissue, skin of left arm. 10 x 5 cm  3. Incision and drainage of right antecubital fossa abscess  4. Debridement of muscle, soft tissue, skin of right arm. 5 x 5 cm  Antibiotics: Zosyn 2/1 >>> Vancomycin 2/1 >>> Oral Levaquin 2/2 >>2/04  HPI/Subjective: Patient alert and endorsed improved pain sx's. Denies SOB or sudden CP.  Objective: Blood pressure 165/76, pulse 120, temperature 101.6 F (38.7 C), temperature source Oral, resp. rate 23, height 5\' 11"  (1.803 m), weight 232 lb 12.9 oz (105.6 kg), SpO2 98.00%.  Intake/Output Summary (Last 24 hours) at 03/13/13 1452 Last data filed at 03/13/13 1043  Gross per 24 hour  Intake   4643 ml  Output   1975 ml  Net   2668 ml   Exam: General: No acute respiratory distress but has resting tachypnea Lungs: Clear to auscultation bilaterally without wheezes or crackles, RA Cardiovascular: Regular tachycardic rate (less than 110 beats per minute) and rhythm without murmur gallop or rub normal S1 and S2, no peripheral edema or JVD Abdomen: Nontender, nondistended, soft, bowel sounds positive, no rebound, no ascites, no appreciable mass Musculoskeletal: No significant cyanosis, clubbing of bilateral lower extremities - L arm markedly swollen but no paralysis or palor and pt denies paresthesias-left arm elevated with sling device-dry dressings to right and left arm Neurological: Alert and oriented x 3, moves all extremities x 4 without focal neurological deficits, CN 2-12 intact  Scheduled Meds:  Scheduled Meds: . docusate sodium  100 mg Oral BID  . famotidine  20 mg Oral BID  . insulin aspart  0-15 Units  Subcutaneous Q4H  . insulin detemir  10 Units Subcutaneous Daily  . levofloxacin  750 mg Oral Daily  . multivitamin with minerals  1 tablet Oral Daily  . pantoprazole  40 mg Oral  Q1200  . piperacillin-tazobactam (ZOSYN)  IV  3.375 g Intravenous Q8H  . piperacillin-tazobactam  4.5 g Intravenous Once  . sodium chloride  3 mL Intravenous Q12H  . vancomycin  1,000 mg Intravenous Q12H    Data Reviewed: Basic Metabolic Panel:  Recent Labs Lab 03/10/13 1810 03/11/13 0130 03/12/13 0440 03/13/13 0856  NA 134* 132* 138 137  K 4.0 3.5* 3.6* 3.8  CL 95* 95* 99 99  CO2 22 22 24 26   GLUCOSE 491* 192* 148* 173*  BUN 17 11 11 9   CREATININE 0.80 0.64 0.63 0.68  CALCIUM 9.7 8.8 8.1* 7.8*  MG  --  1.7  --   --   PHOS  --  2.3  --   --    Liver Function Tests:  Recent Labs Lab 03/11/13 0130 03/12/13 0440  AST 34 33  ALT 31 32  ALKPHOS 181* 259*  BILITOT 0.2* 0.2*  PROT 6.5 6.4  ALBUMIN 2.1* 2.1*   CBC:  Recent Labs Lab 03/10/13 1810 03/11/13 0130 03/12/13 0440 03/13/13 0856  WBC 33.0* 27.9* 30.9* 27.4*  NEUTROABS 29.0*  --   --   --   HGB 13.9 13.4 12.8* 10.4*  HCT 41.1 38.3* 37.0* 30.7*  MCV 83.5 82.4 83.1 82.7  PLT 302 281 347 392   CBG:  Recent Labs Lab 03/12/13 1903 03/12/13 2013 03/13/13 0020 03/13/13 0446 03/13/13 0803  GLUCAP 159* 188* 258* 169* 190*    Recent Results (from the past 240 hour(s))  CULTURE, BLOOD (SINGLE)     Status: None   Collection Time    03/10/13  6:10 PM      Result Value Range Status   Specimen Description BLOOD RIGHT ARM   Final   Special Requests BOTTLES DRAWN AEROBIC AND ANAEROBIC 5CC EACH   Final   Culture  Setup Time     Final   Value: 03/11/2013 02:15     Performed at [facility]   Culture     Final   Value:        BLOOD CULTURE RECEIVED NO GROWTH TO DATE CULTURE WILL BE HELD FOR 5 DAYS BEFORE ISSUING A FINAL NEGATIVE REPORT     Performed at [facility]   Report Status PENDING   Incomplete  MRSA PCR SCREENING     Status: None   Collection Time    03/10/13 11:07 PM      Result Value Range Status   MRSA by PCR NEGATIVE  NEGATIVE Final   Comment:            The GeneXpert MRSA  Assay (FDA     approved for NASAL specimens     only), is one component of a     comprehensive MRSA colonization     surveillance program. It is not     intended to diagnose MRSA     infection nor to guide or     monitor treatment for     MRSA infections.  CULTURE, BLOOD (ROUTINE X 2)     Status: None   Collection Time    03/11/13  3:10 PM      Result Value Range Status   Specimen Description BLOOD RIGHT HAND   Final   Special Requests BOTTLES DRAWN AEROBIC ONLY  3CC   Final   Culture  Setup Time     Final   Value: 03/11/2013 21:41     Performed at [facility]   Culture     Final   Value:        BLOOD CULTURE RECEIVED NO GROWTH TO DATE CULTURE WILL BE HELD FOR 5 DAYS BEFORE ISSUING A FINAL NEGATIVE REPORT     Performed at [facility]   Report Status PENDING   Incomplete  CULTURE, BLOOD (ROUTINE X 2)     Status: None   Collection Time    03/11/13  4:05 PM      Result Value Range Status   Specimen Description BLOOD RIGHT HAND   Final   Special Requests BOTTLES DRAWN AEROBIC ONLY 4CC   Final   Culture  Setup Time     Final   Value: 03/11/2013 21:40     Performed at [facility]   Culture     Final   Value:        BLOOD CULTURE RECEIVED NO GROWTH TO DATE CULTURE WILL BE HELD FOR 5 DAYS BEFORE ISSUING A FINAL NEGATIVE REPORT     Performed at [facility]   Report Status PENDING   Incomplete  GRAM STAIN     Status: None   Collection Time    03/12/13  5:20 PM      Result Value Range Status   Specimen Description ABSCESS LEFT ARM   Final   Special Requests DIFFUSE ARM ABSCESS   Final   Gram Stain     Final   Value: ABUNDANT WBC PRESENT,BOTH PMN AND MONONUCLEAR     FEW GRAM POSITIVE COCCI IN PAIRS IN CLUSTERS     Gram Stain Report Called to,Read Back By and Verified With: Gates Rigg 1914 03/12/13 A BROWNING   Report Status 03/12/2013 FINAL   Final  ANAEROBIC CULTURE     Status: None   Collection Time    03/12/13  5:20 PM      Result Value  Range Status   Specimen Description ABSCESS LEFT ARM   Final   Special Requests NONE  DIFFUSE LEFT ARM ABSCESS   Final   Gram Stain     Final   Value: ABUNDANT WBC PRESENT,BOTH PMN AND MONONUCLEAR     RARE SQUAMOUS EPITHELIAL CELLS PRESENT     FEW GRAM POSITIVE COCCI     IN PAIRS IN CLUSTERS Performed at [facility]     Performed at [facility]   Culture     Final   Value: NO ANAEROBES ISOLATED     Snyder: Gram Stain Report Called to,Read Back By and Verified With: Gates Rigg 1914 03/12/13 A BROWNING     Performed at [facility]   Report Status PENDING   Incomplete  CULTURE, ROUTINE-ABSCESS     Status: None   Collection Time    03/12/13  5:20 PM      Result Value Range Status   Specimen Description ABSCESS LEFT ARM   Final   Special Requests DIFFUSE ARM ABSCESS   Final   Gram Stain     Final   Value: ABUNDANT WBC PRESENT,BOTH PMN AND MONONUCLEAR     RARE SQUAMOUS EPITHELIAL CELLS PRESENT     FEW GRAM POSITIVE COCCI     IN PAIRS IN CLUSTERS Performed at [facility] Gram Stain Report Called to,Read Back By and Verified With: Gram Stain Report Called to,Read  Back By and Verified With: Gates RiggM EVANGELISTA 1914 03/12/13 A BROWNING     [facility]   Culture     Final   Value: NO GROWTH 1 DAY     [facility]   Report Status PENDING   Incomplete  CULTURE, ROUTINE-ABSCESS     Status: None   Collection Time    03/12/13  6:34 PM      Result Value Range Status   Specimen Description ABSCESS FOREARM RIGHT   Final   Special Requests NONE   Final   Gram Stain     Final   Value: MODERATE WBC PRESENT,BOTH PMN AND MONONUCLEAR     NO SQUAMOUS EPITHELIAL CELLS SEEN     NO ORGANISMS SEEN     [facility]   Culture PENDING   Incomplete   Report Status PENDING   Incomplete  ANAEROBIC CULTURE     Status: None   Collection Time    03/12/13  6:34 PM      Result Value Range Status   Specimen Description  ABSCESS FOREARM RIGHT   Final   Special Requests NONE   Final   Gram Stain     Final   Value: MODERATE WBC PRESENT,BOTH PMN AND MONONUCLEAR     NO SQUAMOUS EPITHELIAL CELLS SEEN     RARE GRAM POSITIVE COCCI IN PAIRS     [facility]   Culture     Final   Value: NO ANAEROBES ISOLATED; CULTURE IN PROGRESS FOR 5 DAYS     [facility]   Report Status PENDING   Incomplete     Studies:  Recent x-ray studies have been reviewed in detail by the Attending Physician  Time spent :  35mins     Junious Silkllison Ellis, ANP [facility] Office  [phone] Pager [phone]  **If unable to reach the above provider after paging please contact the Flow Manager @ [phone]  On-Call/Text Page:      [email or web address]      password [email or web address]  If 7PM-7AM, please contact night-coverage [email or web address] Password [email or web address] 03/13/2013, 2:52 PM   LOS: 3 days    I have personally examined this patient and reviewed the entire database. I have reviewed the above Snyder, made any necessary editorial changes, and agree with its content.  Lonia BloodJeffrey T. Leshawn Houseworth, MD [facility]

## 2013-03-13 NOTE — Evaluation (Signed)
Physical Therapy Evaluation Patient Details Name: Phylliss BobJeremy Higby MRN: [id number] DOB: [birth year] Today's Date: 03/13/2013 Time: [id number] PT Time Calculation (min): 21 min  PT Assessment / Plan / Recommendation History of Present Illness  Pt admit with cellulitis and abscess bil forearms due to cocaine injected in forearms.  I&D bil forearms.  left UE worse than right.    Clinical Impression  Pt admitted with above. Pt currently with functional limitations due to the deficits listed below (see PT Problem List). Pt should progress well and be able to go home with HHPT f/u with girlfriend and mom's assist.  Pt will benefit from skilled PT to increase their independence and safety with mobility to allow discharge to the venue listed below.     PT Assessment  Patient needs continued PT services    Follow Up Recommendations  Home health PT;Supervision/Assistance - 24 hour                Equipment Recommendations  Other (comment) (TBA)    Recommendations for Other Services OT consult   Frequency Min 3X/week    Precautions / Restrictions Precautions Precautions: Fall Restrictions Weight Bearing Restrictions: No Other Position/Activity Restrictions: left UE has sling to suspend LUE to keep elevated due to significant edema.  Pt did not have the sling on on arrival.  Girlfriend said they had just taken it down because his hand was hurting.  it was propped on pillows but not in sling.     Pertinent Vitals/Pain VSS, 10/10 pain in bil UEs left worse than right      Mobility  Bed Mobility Overal bed mobility: Needs Assistance;+2 for physical assistance Bed Mobility: Supine to Sit Supine to sit: Mod assist;+2 for physical assistance General bed mobility comments: Pt cannot use UES due to pain.   Transfers Overall transfer level: Needs assistance Equipment used: None Transfers: Sit to/from Stand Sit to Stand: Min assist General transfer comment: needs min assist using right hand to  push up. Ambulation/Gait Ambulation/Gait assistance: Min guard Ambulation Distance (Feet): 250 Feet Assistive device: 1 person hand held assist Gait Pattern/deviations: Step-through pattern Gait velocity interpretation: <1.8 ft/sec, indicative of risk for recurrent falls General Gait Details: Overall steady gait.  Pt at times iwth guarded gait due to pain in hands and trying to maneuver around objects.      Exercises General Exercises - Lower Extremity Ankle Circles/Pumps: AROM;10 reps;Both;Supine Quad Sets: AROM;Both;10 reps;Supine   PT Diagnosis: Generalized weakness;Acute pain  PT Problem List: Decreased activity tolerance;Decreased balance;Decreased mobility;Decreased knowledge of use of DME;Decreased safety awareness;Decreased knowledge of precautions;Pain PT Treatment Interventions: DME instruction;Gait training;Functional mobility training;Therapeutic activities;Therapeutic exercise;Balance training;Patient/family education     PT Goals(Current goals can be found in the care plan section) Acute Rehab PT Goals Patient Stated Goal: to go home PT Goal Formulation: With patient Time For Goal Achievement: 03/20/13 Potential to Achieve Goals: Good  Visit Information  Last PT Received On: 03/13/13 Assistance Needed: +1 History of Present Illness: Pt admit with cellulitis and abscess bil forearms due to cocaine injected in forearms.  I&D bil forearms.  left UE worse than right.         Prior Functioning  Home Living Family/patient expects to be discharged to:: Private residence Living Arrangements: Spouse/significant other Available Help at Discharge: Family;Available 24 hours/day Type of Home: House Home Access: Stairs to enter Entergy CorporationEntrance Stairs-Number of Steps: 7 Entrance Stairs-Rails: Right Home Layout: Two level Alternate Level Stairs-Number of Steps: 9 Alternate Level Stairs-Rails: Right Home Equipment: None Prior  Function Level of Independence:  Independent Communication Communication: No difficulties    Cognition  Cognition Arousal/Alertness: Awake/alert Behavior During Therapy: WFL for tasks assessed/performed Overall Cognitive Status: Within Functional Limits for tasks assessed    Extremity/Trunk Assessment Upper Extremity Assessment Upper Extremity Assessment: Defer to OT evaluation Lower Extremity Assessment Lower Extremity Assessment: Generalized weakness Cervical / Trunk Assessment Cervical / Trunk Assessment: Normal   Balance Balance Overall balance assessment: Needs assistance Standing balance support: No upper extremity supported;During functional activity Standing balance-Leahy Scale: Fair  End of Session PT - End of Session Equipment Utilized During Treatment: Gait belt Activity Tolerance: Patient limited by fatigue;Patient limited by pain Patient left: in chair;with call bell/phone within reach;with family/visitor present Nurse Communication: Mobility status;Patient requests pain meds       INGOLD,Osamu Olguin 03/13/2013, 4:20 PM  [facility] Acute Rehabilitation [phone] (pager)

## 2013-03-14 LAB — CBC
HEMATOCRIT: 27.5 % — AB (ref 39.0–52.0)
Hemoglobin: 9.4 g/dL — ABNORMAL LOW (ref 13.0–17.0)
MCH: 28.4 pg (ref 26.0–34.0)
MCHC: 34.2 g/dL (ref 30.0–36.0)
MCV: 83.1 fL (ref 78.0–100.0)
Platelets: 410 10*3/uL — ABNORMAL HIGH (ref 150–400)
RBC: 3.31 MIL/uL — ABNORMAL LOW (ref 4.22–5.81)
RDW: 13.2 % (ref 11.5–15.5)
WBC: 26 10*3/uL — ABNORMAL HIGH (ref 4.0–10.5)

## 2013-03-14 LAB — BASIC METABOLIC PANEL
BUN: 10 mg/dL (ref 6–23)
CALCIUM: 8 mg/dL — AB (ref 8.4–10.5)
CO2: 20 mEq/L (ref 19–32)
CREATININE: 0.65 mg/dL (ref 0.50–1.35)
Chloride: 104 mEq/L (ref 96–112)
GFR calc Af Amer: >90 mL/min (ref >90)
GLUCOSE: 127 mg/dL — AB (ref 70–99)
Potassium: 3.9 mEq/L (ref 3.7–5.3)
SODIUM: 141 meq/L (ref 137–147)

## 2013-03-14 LAB — GLUCOSE, CAPILLARY
GLUCOSE-CAPILLARY: 174 mg/dL — AB (ref 70–99)
Glucose-Capillary: 259 mg/dL — ABNORMAL HIGH (ref 70–99)
Glucose-Capillary: 239 mg/dL — ABNORMAL HIGH (ref 70–99)

## 2013-03-14 NOTE — Progress Notes (Signed)
Clinical Social Work Department BRIEF PSYCHOSOCIAL ASSESSMENT 03/14/2013  Patient:  Jay Snyder,Jay Snyder     Account Number:  [id number]     Admit date:  03/10/2013  Clinical Social Worker:  Varney BilesANDERSON,Kaelin Holford, LCSWA  Date/Time:  03/13/2013 02:30 PM  Referred by:  Physician  Date Referred:  03/13/2013  Other Referral:   Interview type:  Patient Other interview type:    PSYCHOSOCIAL DATA Living Status:  SIGNIFICANT OTHER Admitted from facility:   Level of care:   Primary support name:  Arvid RightSavanah Sloan ([phone]) Primary support relationship to patient:  PARTNER Degree of support available:   Good--pt's girlfriend provides support to pt    CURRENT CONCERNS Current Concerns  Post-Acute Placement   Other Concerns:    SOCIAL WORK ASSESSMENT / PLAN CSW was consulted for substance abuse and SNF placement for pt. CSW spoke with PT before visiting pt's room, and PT explained home health would be a more appropriate discharge as pt is ambulating well, but he has some difficulty getting up and down from a seated position--however, girlfriend states she will help pt around the house and PT feels home health is appropriate. Girlfriend was at bedside when CSW came to speak with pt, and CSW asked her to step out of the room so pt and CSW could speak one-on-one. Girlfriend friendly and agreed to step out of the room. CSW spoke with pt about his plans following discharge, and pt expressed he is eager to go home. 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 CSW asked pt how difficult he believes it will be to stop and what has worked in the past  to keep him from using when he was clean; pt states he does not think it will be difficult to stop, kindly denying the need for inpatient/outpatient resources for substance abuse and explaining he was clean for a few years and believes he can be clean again after he cuts this acquaintance out of his life. CSW asked if there is anything CSW can provide to support pt in his desire to stop using cocaine, and pt states he does not need information about programs and is confident he can stop independently. No further CSW needs identified, so CSW signing off.   Assessment/plan status:  No Further Intervention Required Other assessment/ plan:   Information/referral to community resources:   ?SNF; PT recommending home health and pt kindly refused information about substance abuse programs.    PATIENT'S/FAMILY'S RESPONSE TO PLAN OF CARE: Good--pt engaged in open and honest conversation with CSW about his cocaine use. Pt states he is confident he can stop using on his own, and thanked CSW for support. CSW utilized motivational interviewing techniques, brief substance abuse counseling techniques, and solution-focused therapy techniques when working with this pt.       Maryclare LabradorJulie Adith Tejada, MSW, [facility]er[facility] HospitalCSWA Clinical Social Worker [phone]

## 2013-03-14 NOTE — Progress Notes (Signed)
Jay Snyder ONG:[id number] DOB: 19-Jun-1980 DOA: 03/10/2013 PCP: No primary provider on file.  Brief narrative: 33 year old male patient known diabetic on metformin at home. Presented with a 3-4 day history of severe left arm swelling. Patient developed the symptoms after injecting cocaine into his arm. Also endorsed fever up to 102.2.   Assessment/Plan:    Sepsis -Sepsis physiology resolved -Source upper extremity cellulitis with abscesses -Intraoperative wound cultures positive for staph aureus with sensitivities pending -Continue empiric treatment with antibiotics and other supportive care-hopefully cultures will allow for transition to oral agent soon  -WBC with slow downward trend -Blood cultures no growth to date    Cellulitis and multifocal abscesses / Edema of upper extremity -MRI left upper extremity revealed abscesses extending from the antecubital region primarily involving the pronator teres muscle as well as myositis of the pronator teres, supinator, brachial radialis, proximal extensor musculature and to a lesser extent flexor carpi radialis as well as confirmed extensive cellulitis and forearm. -now s/p I/D with muscle and soft tissue/skin debridement 2/3-per orthopedic surgeon no further surgical intervention required -Treat pain with Tylenol, morphine, oxycodone and Toradol    Limited IV access/IVDA -likely due to IVD use + sepsis - unable to obtain R arm PICC due to fear of local cellulitis  -PCCM placed CVL 2/2 -pt counseled by attending MD re need complete cessation of illicit drugs    Diabetes mellitus type 2, uncontrolled -Began low-dose Levemir with moderate sliding scale insulin 2/2 -Hemoglobin A1c 9.8 -Current CBG is well controlled   HTN -patient has been told in the past that he had HTN but he was not on treatment -Suspect pain contributing -IV fluids were at 75 cc per hour so we will KVO and  follow  DVT prophylaxis: SCDs-heparin versus Lovenox postoperatively at discretion of orthopedic physician Code Status: Full Family Communication: Significant other at bedside Disposition Plan/Expected LOS: Transfer to floor  Consultants: Orthopedics  Procedures: 03/12/12 per Dr. Roda ShuttersXu: 1. Incision and drainage of left upper extremity abscesses.  2. Debridement of muscle, and soft tissue, skin of left arm. 10 x 5 cm  3. Incision and drainage of right antecubital fossa abscess  4. Debridement of muscle, soft tissue, skin of right arm. 5 x 5 cm  Antibiotics: Zosyn 2/1 >>> Vancomycin 2/1 >>> Oral Levaquin 2/2 >>2/04  HPI/Subjective: Patient alert and endorsed adequate pain control. Denies prior history of hypertension. No shortness of breath or chest pain. No nausea or abdominal symptoms.  Objective: Blood pressure 165/109, pulse 102, temperature 97.6 F (36.4 C), temperature source Oral, resp. rate 26, height 5\' 11"  (1.803 m), weight 232 lb 12.9 oz (105.6 kg), SpO2 99.00%.  Intake/Output Summary (Last 24 hours) at 03/14/13 1238 Last data filed at 03/14/13 1200  Gross per 24 hour  Intake   3028 ml  Output   2200 ml  Net    828 ml   Exam: General: No acute respiratory distress but has resting tachypnea Lungs: Clear to auscultation bilaterally without wheezes or crackles, RA Cardiovascular: Regular rate and rhythm without murmur gallop or rub normal S1 and S2, no peripheral edema or JVD-occasionally low-grade tachycardia Abdomen: Nontender, nondistended, soft, bowel sounds positive, no rebound, no ascites, no appreciable mass Musculoskeletal: No significant cyanosis, clubbing of bilateral lower extremities - L arm markedly swollen but no paralysis or palor and pt denies paresthesias-left arm elevated with sling device-dry dressings to right and left arm Neurological: Alert and oriented  x 3, moves all extremities x 4 without focal neurological deficits, CN 2-12 intact  Scheduled  Meds:  Scheduled Meds: . docusate sodium  100 mg Oral BID  . famotidine  20 mg Oral BID  . insulin aspart  0-15 Units Subcutaneous Q4H  . insulin detemir  10 Units Subcutaneous Daily  . multivitamin with minerals  1 tablet Oral Daily  . pantoprazole  40 mg Oral Q1200  . piperacillin-tazobactam  4.5 g Intravenous Once  . sodium chloride  3 mL Intravenous Q12H  . vancomycin  1,000 mg Intravenous Q12H    Data Reviewed: Basic Metabolic Panel:  Recent Labs Lab 03/10/13 1810 03/11/13 0130 03/12/13 0440 03/13/13 0856 03/14/13 0338  NA 134* 132* 138 137 141  K 4.0 3.5* 3.6* 3.8 3.9  CL 95* 95* 99 99 104  CO2 22 22 24 26 20   GLUCOSE 491* 192* 148* 173* 127*  BUN 17 11 11 9 10   CREATININE 0.80 0.64 0.63 0.68 0.65  CALCIUM 9.7 8.8 8.1* 7.8* 8.0*  MG  --  1.7  --   --   --   PHOS  --  2.3  --   --   --    Liver Function Tests:  Recent Labs Lab 03/11/13 0130 03/12/13 0440  AST 34 33  ALT 31 32  ALKPHOS 181* 259*  BILITOT 0.2* 0.2*  PROT 6.5 6.4  ALBUMIN 2.1* 2.1*   CBC:  Recent Labs Lab 03/10/13 1810 03/11/13 0130 03/12/13 0440 03/13/13 0856 03/14/13 0338  WBC 33.0* 27.9* 30.9* 27.4* 26.0*  NEUTROABS 29.0*  --   --   --   --   HGB 13.9 13.4 12.8* 10.4* 9.4*  HCT 41.1 38.3* 37.0* 30.7* 27.5*  MCV 83.5 82.4 83.1 82.7 83.1  PLT 302 281 347 392 410*   CBG:  Recent Labs Lab 03/13/13 0446 03/13/13 0803 03/13/13 1248 03/13/13 1652 03/13/13 1952  GLUCAP 169* 190* 209* 180* 185*    Recent Results (from the past 240 hour(s))  CULTURE, BLOOD (SINGLE)     Status: None   Collection Time    03/10/13  6:10 PM      Result Value Range Status   Specimen Description BLOOD RIGHT ARM   Final   Special Requests BOTTLES DRAWN AEROBIC AND ANAEROBIC 5CC EACH   Final   Culture  Setup Time     Final   Value: 03/11/2013 02:15     Performed at [facility]   Culture     Final   Value:        BLOOD CULTURE RECEIVED NO GROWTH TO DATE CULTURE WILL BE HELD FOR 5 DAYS  BEFORE ISSUING A FINAL NEGATIVE REPORT     Performed at [facility]   Report Status PENDING   Incomplete  MRSA PCR SCREENING     Status: None   Collection Time    03/10/13 11:07 PM      Result Value Range Status   MRSA by PCR NEGATIVE  NEGATIVE Final   Comment:            The GeneXpert MRSA Assay (FDA     approved for NASAL specimens     only), is one component of a     comprehensive MRSA colonization     surveillance program. It is not     intended to diagnose MRSA     infection nor to guide or     monitor treatment for     MRSA infections.  CULTURE, BLOOD (ROUTINE X 2)     Status: None   Collection Time    03/11/13  3:10 PM      Result Value Range Status   Specimen Description BLOOD RIGHT HAND   Final   Special Requests BOTTLES DRAWN AEROBIC ONLY 3CC   Final   Culture  Setup Time     Final   Value: 03/11/2013 21:41     Performed at [facility]   Culture     Final   Value:        BLOOD CULTURE RECEIVED NO GROWTH TO DATE CULTURE WILL BE HELD FOR 5 DAYS BEFORE ISSUING A FINAL NEGATIVE REPORT     Performed at [facility]   Report Status PENDING   Incomplete  CULTURE, BLOOD (ROUTINE X 2)     Status: None   Collection Time    03/11/13  4:05 PM      Result Value Range Status   Specimen Description BLOOD RIGHT HAND   Final   Special Requests BOTTLES DRAWN AEROBIC ONLY 4CC   Final   Culture  Setup Time     Final   Value: 03/11/2013 21:40     Performed at [facility]   Culture     Final   Value:        BLOOD CULTURE RECEIVED NO GROWTH TO DATE CULTURE WILL BE HELD FOR 5 DAYS BEFORE ISSUING A FINAL NEGATIVE REPORT     Performed at [facility]   Report Status PENDING   Incomplete  GRAM STAIN     Status: None   Collection Time    03/12/13  5:20 PM      Result Value Range Status   Specimen Description ABSCESS LEFT ARM   Final   Special Requests DIFFUSE ARM ABSCESS   Final   Gram Stain     Final   Value: ABUNDANT WBC PRESENT,BOTH PMN  AND MONONUCLEAR     FEW GRAM POSITIVE COCCI IN PAIRS IN CLUSTERS     Gram Stain Report Called to,Read Back By and Verified With: Gates Rigg 1914 03/12/13 A BROWNING   Report Status 03/12/2013 FINAL   Final  ANAEROBIC CULTURE     Status: None   Collection Time    03/12/13  5:20 PM      Result Value Range Status   Specimen Description ABSCESS LEFT ARM   Final   Special Requests NONE  DIFFUSE LEFT ARM ABSCESS   Final   Gram Stain     Final   Value: ABUNDANT WBC PRESENT,BOTH PMN AND MONONUCLEAR     RARE SQUAMOUS EPITHELIAL CELLS PRESENT     FEW GRAM POSITIVE COCCI     IN PAIRS IN CLUSTERS Performed at [facility]     Performed at [facility]   Culture     Final   Value: NO ANAEROBES ISOLATED     Note: Gram Stain Report Called to,Read Back By and Verified With: Gates Rigg 1914 03/12/13 A BROWNING     Performed at [facility]   Report Status PENDING   Incomplete  CULTURE, ROUTINE-ABSCESS     Status: None   Collection Time    03/12/13  5:20 PM      Result Value Range Status   Specimen Description ABSCESS LEFT ARM   Final   Special Requests DIFFUSE ARM ABSCESS   Final   Gram Stain     Final   Value: ABUNDANT WBC  PRESENT,BOTH PMN AND MONONUCLEAR     RARE SQUAMOUS EPITHELIAL CELLS PRESENT     FEW GRAM POSITIVE COCCI     IN PAIRS IN CLUSTERS Performed at [facility] Gram Stain Report Called to,Read Back By and Verified With: Gram Stain Report Called to,Read Back By and Verified With: Gates Rigg 1914 03/12/13 A BROWNING     Performed at [facility]     Final   Value: MODERATE STAPHYLOCOCCUS AUREUS     Note: RIFAMPIN AND GENTAMICIN SHOULD NOT BE USED AS SINGLE DRUGS FOR TREATMENT OF STAPH INFECTIONS.     Performed at [facility]   Report Status PENDING   Incomplete  CULTURE, ROUTINE-ABSCESS     Status: None   Collection Time    03/12/13  6:34 PM      Result Value Range Status   Specimen Description ABSCESS FOREARM RIGHT    Final   Special Requests NONE   Final   Gram Stain     Final   Value: MODERATE WBC PRESENT,BOTH PMN AND MONONUCLEAR     NO SQUAMOUS EPITHELIAL CELLS SEEN     NO ORGANISMS SEEN     Performed at [facility]   Culture PENDING   Incomplete   Report Status PENDING   Incomplete  ANAEROBIC CULTURE     Status: None   Collection Time    03/12/13  6:34 PM      Result Value Range Status   Specimen Description ABSCESS FOREARM RIGHT   Final   Special Requests NONE   Final   Gram Stain     Final   Value: MODERATE WBC PRESENT,BOTH PMN AND MONONUCLEAR     NO SQUAMOUS EPITHELIAL CELLS SEEN     RARE GRAM POSITIVE COCCI IN PAIRS     Performed at [facility]   Culture     Final   Value: NO ANAEROBES ISOLATED; CULTURE IN PROGRESS FOR 5 DAYS     Performed at [facility]   Report Status PENDING   Incomplete     Studies:  Recent x-ray studies have been reviewed in detail by the Attending Physician  Time spent :      Junious Silk, ANP [facility] Office  [phone] Pager [phone]  **If unable to reach the above provider after paging please contact the Flow Manager @ [phone]  On-Call/Text Page:      [email or web address]      password [email or web address]  If 7PM-7AM, please contact night-coverage [email or web address] Password [email or web address] 03/14/2013, 12:38 PM   LOS: 4 days     I have examined the patient, reviewed the chart and modified the above note which I agree with.   Doye Montilla,MD [phone] 03/14/2013, 6:57 PM

## 2013-03-14 NOTE — Progress Notes (Signed)
PT Cancellation Note  Patient Details Name: Phylliss BobJeremy Kalafut MRN: [id number] DOB: 1980-06-11   Cancelled Treatment:    Reason Eval/Treat Not Completed: Other (comment) (Refused to get OOB secondary to just had meds/lethargy)   INGOLD,Ziyah Cordoba 03/14/2013, 1:42 PM [facility] SurgeryDawn Ingold,PT Acute Rehabilitation [phone] (pager)

## 2013-03-14 NOTE — Progress Notes (Signed)
Patient ID: Jay Snyder, male   DOB: Sep 26, 1980, 33 y.o.   MRN: [id number] I took down the dressings on both arms to assess his incisions and soft tissue in general.  Both sides show no redness at this point and the swelling has gone down significantly.  Both sides have intact neurologic exams in his hands and both hands are well perfused.  Both sides have soft compartments.  I do not anticipate having to return to the OR for either side during this hospitalization.

## 2013-03-14 NOTE — Evaluation (Signed)
Occupational Therapy Evaluation Patient Details Name: Jay Snyder MRN: [id number] DOB: [birth year] Today's Date: 03/14/2013 Time: [id number] OT Time Calculation (min): 29 min  OT Assessment / Plan / Recommendation History of present illness Pt admit with cellulitis and abscess bil forearms due to cocaine injected in forearms.  I&D bil forearms.  left UE worse than right.     Clinical Impression   Pt requires total care for ADL due to no functional use of B UE, the R appears primarily due to wrapping.  Pt is L handed which is painful and edematous.  He needs minimal assistance for OOB activity and is assisted by his girlfriend who is bedside.  Will follow acutely to instruct in ADL and AE use as needed, educate in edema management techniques, and to address ADL transfers.   OT Assessment  Patient needs continued OT Services    Follow Up Recommendations  No OT follow up    Barriers to Discharge      Equipment Recommendations  None recommended by OT    Recommendations for Other Services    Frequency  Min 2X/week    Precautions / Restrictions Precautions Precautions: Fall Restrictions Weight Bearing Restrictions: No Other Position/Activity Restrictions: LE in suspended sling, uses 3 pillows when pt's fingers become tingly and then return to sling.   Pertinent Vitals/Pain 6/10 L UE, RN aware, VSS stable    ADL  Eating/Feeding: +1 Total assistance Where Assessed - Eating/Feeding: Bed level Grooming: Wash/dry face;+1 Total assistance Where Assessed - Grooming: Unsupported sitting Upper Body Bathing: +1 Total assistance Where Assessed - Upper Body Bathing: Unsupported sitting Lower Body Bathing: +1 Total assistance Where Assessed - Lower Body Bathing: Unsupported sitting;Unsupported standing Upper Body Dressing: Maximal assistance Where Assessed - Upper Body Dressing: Unsupported sitting Lower Body Dressing: +1 Total assistance Where Assessed - Lower Body Dressing: Unsupported  sitting;Unsupported standing Toilet Transfer: Hydrographic surveyorMin guard Toilet Transfer Method: Sit to Baristastand Toilet Transfer Equipment: Regular height toilet Toileting - Clothing Manipulation and Hygiene: +1 Total assistance Where Assessed - Toileting Clothing Manipulation and Hygiene: Standing Transfers/Ambulation Related to ADLs: min guard assist ADL Comments: Pt with limited ability to use R UE primarily due to wrapping impeding elbow flexion.  L is non functional.    OT Diagnosis: Generalized weakness;Acute pain  OT Problem List: Decreased strength;Decreased range of motion;Decreased activity tolerance;Impaired balance (sitting and/or standing);Pain;Impaired UE functional use;Increased edema;Decreased coordination OT Treatment Interventions: Self-care/ADL training;DME and/or AE instruction;Patient/family education;Therapeutic exercise   OT Goals(Current goals can be found in the care plan section) Acute Rehab OT Goals Patient Stated Goal: to go home OT Goal Formulation: With patient Time For Goal Achievement: 03/28/13 Potential to Achieve Goals: Good ADL Goals Pt Will Perform Eating: with adaptive utensils;sitting;with modified independence Pt Will Perform Grooming: standing;with modified independence Pt Will Perform Upper Body Bathing: sitting;with modified independence Pt Will Perform Lower Body Bathing: sit to/from stand;with modified independence Pt Will Perform Upper Body Dressing: sitting;with modified independence Pt Will Perform Lower Body Dressing: sit to/from stand;with modified independence Pt Will Transfer to Toilet: with supervision;ambulating;regular height toilet Pt Will Perform Toileting - Clothing Manipulation and hygiene: with modified independence;sit to/from stand Pt/caregiver will Perform Home Exercise Program: Both right and left upper extremity;Independently (Pt will perform AROM of shoulders, wrists, hands 3 x a day.) Additional ADL Goal #1: Pt will be knowledgeable in edema  management techniques including elevation, retrograde massage, and AROM. Additional ADL Goal #2: Pt will perform bed mobility independently in preparation for ADL.  Visit Information  Last OT Received On: 03/14/13 Assistance Needed: +1 History of Present Illness: Pt admit with cellulitis and abscess bil forearms due to cocaine injected in forearms.  I&D bil forearms.  left UE worse than right.         Prior Functioning     Home Living Family/patient expects to be discharged to:: Private residence Living Arrangements: Spouse/significant other Available Help at Discharge: Family;Available 24 hours/day Type of Home: House Home Access: Stairs to enter [employer] of Steps: 7 Entrance Stairs-Rails: Right Home Layout: Two level Alternate Level Stairs-Number of Steps: 9 Alternate Level Stairs-Rails: Right Home Equipment: None Prior Function Level of Independence: Independent Communication Communication: No difficulties Dominant Hand: Left         Vision/Perception Vision - History Baseline Vision: No visual deficits   Cognition  Cognition Arousal/Alertness: Awake/alert Behavior During Therapy: WFL for tasks assessed/performed Overall Cognitive Status: Within Functional Limits for tasks assessed    Extremity/Trunk Assessment Upper Extremity Assessment Upper Extremity Assessment: RUE deficits/detail;LUE deficits/detail RUE Deficits / Details: dressed and wrapped from forearm to proximal to elbow, fingers and shoulder WNL RUE: Unable to fully assess due to immobilization LUE Deficits / Details: Edematous, wrapped and dressed from wrist to proximal to shoulder, shoulder WNL, finger ROM limited by edema LUE: Unable to fully assess due to immobilization LUE Coordination: decreased fine motor;decreased gross motor Lower Extremity Assessment Lower Extremity Assessment: Defer to PT evaluation Cervical / Trunk Assessment Cervical / Trunk Assessment: Normal      Mobility Bed Mobility Overal bed mobility: Needs Assistance Bed Mobility: Supine to Sit;Sit to Supine Supine to sit: Mod assist Sit to supine: Min assist General bed mobility comments: assist to right trunk for supine to sit, supported L UE in sit to supine Transfers Overall transfer level: Needs assistance Transfers: Sit to/from Stand Sit to Stand: Min assist     Exercise  shoulder AROM B x 10, hand ROM B x 10   Balance     End of Session OT - End of Session Activity Tolerance: Patient limited by pain Patient left: in bed;with call bell/phone within reach;with nursing/sitter in room  GO     Evern Bio 03/14/2013, 10:16 AM [phone]

## 2013-03-14 NOTE — Progress Notes (Signed)
Spoke with patient about diabetes and outpatient regimen for diabetes control.  Patient reports that he was taking a "combination of Metformin and another diabetes medicine"; he reports he "thinks it was MetaGlip 1000/2.5 mg once a day".  Patient reports that he took the medication as prescribed.  When asked about how often he sees PCP for follow up patient reports that he needs to go to the doctor; "I have not been in quite a while".  Discussed A1C results (9.8% on 03/10/13).  Patient reports that his last A1C with his PCP was in the 8% range.  Discussed importance of checking CBGs and maintaining good CBG control to prevent long-term and short-term complications. Discussed impact of stress, sickness, nutrition, and exercise on diabetes control.  Patient reports that he follows a diabetic diet and when asked if he felt he needed additional education on Carb Modified diabetic diet he states "No, I know what I need to eat and drink". Patient verbalized understanding of information discussed and reports that he has no further questions at this time related to diabetes.  MD-Please inform nursing staff if patient will be discharged on insulin so that patient can be properly educated prior to discharge.  Will continue to follow as an inpatient.  Thanks, Orlando PennerMarie Shaasia Odle, RN, MSN, CCRN Diabetes Coordinator Inpatient Diabetes Program [phone] (Team Pager) [phone] ([facility] office) [phone] [facility] office)

## 2013-03-14 NOTE — Progress Notes (Signed)
Inpatient Diabetes Program Recommendations  AACE/ADA: New Consensus Statement on Inpatient Glycemic Control (2013)  Target Ranges:  Prepandial:   less than 140 mg/dL      Peak postprandial:   less than 180 mg/dL (1-2 hours)      Critically ill patients:  140 - 180 mg/dL  Results for Jay Snyder, Jay Snyder (MRN [id number]) as of 03/14/2013 07:55  Ref. Range 03/13/2013 04:46 03/13/2013 08:03 03/13/2013 12:48 03/13/2013 16:52 03/13/2013 19:52  Glucose-Capillary Latest Range: 70-99 mg/dL 478169 (H) 295190 (H) 621209 (H) 180 (H) 185 (H)   Inpatient Diabetes Program Recommendations Correction (SSI): Please increase Novolog correction to resistant correction scale.   Thanks, Orlando PennerMarie Henritta Mutz, RN, MSN, CCRN Diabetes Coordinator Inpatient Diabetes Program [phone] (Team Pager) [phone] (AP office) [phone] [facility] office)

## 2013-03-14 NOTE — Progress Notes (Signed)
Surgical was in and changed dressing 25 minutes approximately after pt received a po tablet for pain. Pt stated he was still having excruciating pain rating a 10/10 with facial grimacing. After dressing change was complete pt received a different kind of pain medication iv per md orders. Will monitor.

## 2013-03-15 LAB — GLUCOSE, CAPILLARY
GLUCOSE-CAPILLARY: 224 mg/dL — AB (ref 70–99)
GLUCOSE-CAPILLARY: 211 mg/dL — AB (ref 70–99)
GLUCOSE-CAPILLARY: 224 mg/dL — AB (ref 70–99)
GLUCOSE-CAPILLARY: 213 mg/dL — AB (ref 70–99)
Glucose-Capillary: 234 mg/dL — ABNORMAL HIGH (ref 70–99)
Glucose-Capillary: 203 mg/dL — ABNORMAL HIGH (ref 70–99)

## 2013-03-15 LAB — CULTURE, ROUTINE-ABSCESS

## 2013-03-15 LAB — CBC
HEMATOCRIT: 27.8 % — AB (ref 39.0–52.0)
HEMOGLOBIN: 9.8 g/dL — AB (ref 13.0–17.0)
MCH: 29.2 pg (ref 26.0–34.0)
MCHC: 35.3 g/dL (ref 30.0–36.0)
MCV: 82.7 fL (ref 78.0–100.0)
Platelets: 523 10*3/uL — ABNORMAL HIGH (ref 150–400)
RBC: 3.36 MIL/uL — ABNORMAL LOW (ref 4.22–5.81)
RDW: 13.1 % (ref 11.5–15.5)
WBC: 20.4 10*3/uL — AB (ref 4.0–10.5)

## 2013-03-15 LAB — VANCOMYCIN, TROUGH

## 2013-03-15 MED ORDER — CEFAZOLIN SODIUM 1-5 GM-% IV SOLN
1.0000 g | Freq: Three times a day (TID) | INTRAVENOUS | Status: DC
  Administered 2013-03-15: 1 g via INTRAVENOUS
  Filled 2013-03-15 (×3): qty 50

## 2013-03-15 MED ORDER — VANCOMYCIN HCL IN DEXTROSE 1-5 GM/200ML-% IV SOLN
1000.0000 mg | Freq: Three times a day (TID) | INTRAVENOUS | Status: DC
  Administered 2013-03-15 – 2013-03-17 (×5): 1000 mg via INTRAVENOUS
  Filled 2013-03-15 (×6): qty 200

## 2013-03-15 MED ORDER — VANCOMYCIN HCL 10 G IV SOLR
1250.0000 mg | Freq: Three times a day (TID) | INTRAVENOUS | Status: DC
  Administered 2013-03-15: 1250 mg via INTRAVENOUS
  Filled 2013-03-15 (×2): qty 1250

## 2013-03-15 NOTE — Progress Notes (Signed)
[facility]  Phylliss BobJeremy Leaming UJW:[id number]:[id number] DOB: 24-Jun-1980 DOA: 03/10/2013 PCP: No primary provider on file.  Brief narrative: 33 year old male patient known diabetic on metformin at home. Presented with a 3-4 day history of severe left arm swelling. Patient developed the symptoms after injecting cocaine into his arm. Also endorsed fever up to 102.2.   Assessment/Plan:    Sepsis -Sepsis physiology resolved -Source upper extremity cellulitis with abscesses -Intraoperative wound cultures positive for staph aureus-cont IV anbx's while inpatient then transition to PO- keep in mind pt is self pay and has to pay for meds OOP -WBC with slow downward trend -Blood cultures no growth to date    Cellulitis and multifocal abscesses / Edema of upper extremity -MRI left upper extremity revealed abscesses extending from the antecubital region primarily involving the pronator teres muscle as well as myositis of the pronator teres, supinator, brachial radialis, proximal extensor musculature and to a lesser extent flexor carpi radialis as well as confirmed extensive cellulitis and forearm. -now s/p I/D with muscle and soft tissue/skin debridement 2/3-per orthopedic surgeon no further surgical intervention required -Treat pain with Tylenol, morphine, oxycodone and Toradol    Limited IV access/IVDA -likely due to IVD use + sepsis - unable to obtain R arm PICC due to fear of local cellulitis  -PCCM placed CVL 2/2 -pt counseled by attending MD re need complete cessation of illicit drugs    Diabetes mellitus type 2, uncontrolled -Began low-dose Levemir with moderate sliding scale insulin 2/2 -Hemoglobin A1c 9.8 -Current CBG is well controlled   HTN -patient has been told in the past that he had HTN but he was not on treatment -Suspect pain contributing -up at times and may need to begin meds before dc- has not followed up with PCP in "some time"  DVT prophylaxis:  SCDs-heparin versus Lovenox postoperatively at discretion of orthopedic physician Code Status: Full Family Communication: Significant other at bedside Disposition Plan/Expected LOS: Transfer to floor  Consultants: Orthopedics  Procedures: 03/12/12 per Dr. Roda ShuttersXu: 1. Incision and drainage of left upper extremity abscesses.  2. Debridement of muscle, and soft tissue, skin of left arm. 10 x 5 cm  3. Incision and drainage of right antecubital fossa abscess  4. Debridement of muscle, soft tissue, skin of right arm. 5 x 5 cm  Antibiotics: Zosyn 2/1 >>> Vancomycin 2/1 >>> Oral Levaquin 2/2 >>2/04  HPI/Subjective: Patient alert and observed walking in hall. Still with some pain control issues.  Objective: Blood pressure 160/67, pulse 105, temperature 98.2 F (36.8 C), temperature source Oral, resp. rate 20, height 5\' 11"  (1.803 m), weight 232 lb 12.9 oz (105.6 kg), SpO2 97.00%.  Intake/Output Summary (Last 24 hours) at 03/15/13 1225 Last data filed at 03/15/13 1200  Gross per 24 hour  Intake    263 ml  Output   1701 ml  Net  -1438 ml   Exam: General: No acute respiratory distress but has resting tachypnea Lungs: Clear to auscultation bilaterally without wheezes or crackles, RA Cardiovascular: Regular rate and rhythm without murmur gallop or rub normal S1 and S2, no peripheral edema or JVD-occasionally low-grade tachycardia Abdomen: Nontender, nondistended, soft, bowel sounds positive, no rebound, no ascites, no appreciable mass Musculoskeletal: No significant cyanosis, clubbing of bilateral lower extremities - L arm markedly swollen but no paralysis or palor and pt denies paresthesias-left arm elevated with sling device-dry dressings to right and left arm Neurological: Alert and oriented x 3, moves all extremities x  4 without focal neurological deficits, CN 2-12 intact  Scheduled Meds:  Scheduled Meds: . docusate sodium  100 mg Oral BID  . famotidine  20 mg Oral BID  . insulin aspart   0-15 Units Subcutaneous Q4H  . insulin detemir  10 Units Subcutaneous Daily  . multivitamin with minerals  1 tablet Oral Daily  . pantoprazole  40 mg Oral Q1200  . piperacillin-tazobactam  4.5 g Intravenous Once  . sodium chloride  3 mL Intravenous Q12H  . vancomycin  1,250 mg Intravenous Q8H    Data Reviewed: Basic Metabolic Panel:  Recent Labs Lab 03/10/13 1810 03/11/13 0130 03/12/13 0440 03/13/13 0856 03/14/13 0338  NA 134* 132* 138 137 141  K 4.0 3.5* 3.6* 3.8 3.9  CL 95* 95* 99 99 104  CO2 22 22 24 26 20   GLUCOSE 491* 192* 148* 173* 127*  BUN 17 11 11 9 10   CREATININE 0.80 0.64 0.63 0.68 0.65  CALCIUM 9.7 8.8 8.1* 7.8* 8.0*  MG  --  1.7  --   --   --   PHOS  --  2.3  --   --   --    Liver Function Tests:  Recent Labs Lab 03/11/13 0130 03/12/13 0440  AST 34 33  ALT 31 32  ALKPHOS 181* 259*  BILITOT 0.2* 0.2*  PROT 6.5 6.4  ALBUMIN 2.1* 2.1*   CBC:  Recent Labs Lab 03/10/13 1810 03/11/13 0130 03/12/13 0440 03/13/13 0856 03/14/13 0338 03/15/13 0821  WBC 33.0* 27.9* 30.9* 27.4* 26.0* 20.4*  NEUTROABS 29.0*  --   --   --   --   --   HGB 13.9 13.4 12.8* 10.4* 9.4* 9.8*  HCT 41.1 38.3* 37.0* 30.7* 27.5* 27.8*  MCV 83.5 82.4 83.1 82.7 83.1 82.7  PLT 302 281 347 392 410* 523*   CBG:  Recent Labs Lab 03/14/13 1226 03/14/13 1709 03/14/13 2100 03/15/13 0531 03/15/13 0730  GLUCAP 259* 239* 234* 224* 203*    Recent Results (from the past 240 hour(s))  CULTURE, BLOOD (SINGLE)     Status: None   Collection Time    03/10/13  6:10 PM      Result Value Range Status   Specimen Description BLOOD RIGHT ARM   Final   Special Requests BOTTLES DRAWN AEROBIC AND ANAEROBIC 5CC EACH   Final   Culture  Setup Time     Final   Value: 03/11/2013 02:15     Performed at [facility]   Culture     Final   Value:        BLOOD CULTURE RECEIVED NO GROWTH TO DATE CULTURE WILL BE HELD FOR 5 DAYS BEFORE ISSUING A FINAL NEGATIVE REPORT     Performed at [facility]   Report Status PENDING   Incomplete  MRSA PCR SCREENING     Status: None   Collection Time    03/10/13 11:07 PM      Result Value Range Status   MRSA by PCR NEGATIVE  NEGATIVE Final   Comment:            The GeneXpert MRSA Assay (FDA     approved for NASAL specimens     only), is one component of a     comprehensive MRSA colonization     surveillance program. It is not     intended to diagnose MRSA     infection nor to guide or     monitor treatment for  MRSA infections.  CULTURE, BLOOD (ROUTINE X 2)     Status: None   Collection Time    03/11/13  3:10 PM      Result Value Range Status   Specimen Description BLOOD RIGHT HAND   Final   Special Requests BOTTLES DRAWN AEROBIC ONLY 3CC   Final   Culture  Setup Time     Final   Value: 03/11/2013 21:41     Performed at [facility]   Culture     Final   Value:        BLOOD CULTURE RECEIVED NO GROWTH TO DATE CULTURE WILL BE HELD FOR 5 DAYS BEFORE ISSUING A FINAL NEGATIVE REPORT     Performed at [facility]   Report Status PENDING   Incomplete  CULTURE, BLOOD (ROUTINE X 2)     Status: None   Collection Time    03/11/13  4:05 PM      Result Value Range Status   Specimen Description BLOOD RIGHT HAND   Final   Special Requests BOTTLES DRAWN AEROBIC ONLY 4CC   Final   Culture  Setup Time     Final   Value: 03/11/2013 21:40     Performed at [facility]   Culture     Final   Value:        BLOOD CULTURE RECEIVED NO GROWTH TO DATE CULTURE WILL BE HELD FOR 5 DAYS BEFORE ISSUING A FINAL NEGATIVE REPORT     Performed at [facility]   Report Status PENDING   Incomplete  GRAM STAIN     Status: None   Collection Time    03/12/13  5:20 PM      Result Value Range Status   Specimen Description ABSCESS LEFT ARM   Final   Special Requests DIFFUSE ARM ABSCESS   Final   Gram Stain     Final   Value: ABUNDANT WBC PRESENT,BOTH PMN AND MONONUCLEAR     FEW GRAM POSITIVE COCCI IN PAIRS IN CLUSTERS       Gram Stain Report Called to,Read Back By and Verified With: Gates Rigg 1914 03/12/13 A BROWNING   Report Status 03/12/2013 FINAL   Final  ANAEROBIC CULTURE     Status: None   Collection Time    03/12/13  5:20 PM      Result Value Range Status   Specimen Description ABSCESS LEFT ARM   Final   Special Requests NONE  DIFFUSE LEFT ARM ABSCESS   Final   Gram Stain     Final   Value: ABUNDANT WBC PRESENT,BOTH PMN AND MONONUCLEAR     RARE SQUAMOUS EPITHELIAL CELLS PRESENT     FEW GRAM POSITIVE COCCI     IN PAIRS IN CLUSTERS Performed at [facility]     Performed at [facility]   Culture     Final   Value: NO ANAEROBES ISOLATED     Note: Gram Stain Report Called to,Read Back By and Verified With: Gates Rigg 1914 03/12/13 A BROWNING     Performed at [facility]   Report Status PENDING   Incomplete  CULTURE, ROUTINE-ABSCESS     Status: None   Collection Time    03/12/13  5:20 PM      Result Value Range Status   Specimen Description ABSCESS LEFT ARM   Final   Special Requests DIFFUSE ARM ABSCESS   Final   Gram Stain     Final  Value: ABUNDANT WBC PRESENT,BOTH PMN AND MONONUCLEAR     RARE SQUAMOUS EPITHELIAL CELLS PRESENT     FEW GRAM POSITIVE COCCI     IN PAIRS IN CLUSTERS Performed at [facility] Gram Stain Report Called to,Read Back By and Verified With: Gram Stain Report Called to,Read Back By and Verified With: Gates Rigg 1914 03/12/13 A BROWNING     Performed at [facility]   Culture     Final   Value: MODERATE STAPHYLOCOCCUS AUREUS     Note: RIFAMPIN AND GENTAMICIN SHOULD NOT BE USED AS SINGLE DRUGS FOR TREATMENT OF STAPH INFECTIONS. This organism DOES NOT demonstrate inducible Clindamycin resistance in vitro.     Performed at [facility]   Report Status 03/15/2013 FINAL   Final   Organism ID, Bacteria STAPHYLOCOCCUS AUREUS   Final  CULTURE, ROUTINE-ABSCESS     Status: None   Collection Time    03/12/13  6:34 PM       Result Value Range Status   Specimen Description ABSCESS FOREARM RIGHT   Final   Special Requests NONE   Final   Gram Stain     Final   Value: MODERATE WBC PRESENT,BOTH PMN AND MONONUCLEAR     NO SQUAMOUS EPITHELIAL CELLS SEEN     NO ORGANISMS SEEN     Performed at [facility]   Culture     Final   Value: FEW STAPHYLOCOCCUS AUREUS     Note: RIFAMPIN AND GENTAMICIN SHOULD NOT BE USED AS SINGLE DRUGS FOR TREATMENT OF STAPH INFECTIONS. This organism DOES NOT demonstrate inducible Clindamycin resistance in vitro.     Performed at [facility]   Report Status 03/15/2013 FINAL   Final   Organism ID, Bacteria STAPHYLOCOCCUS AUREUS   Final  ANAEROBIC CULTURE     Status: None   Collection Time    03/12/13  6:34 PM      Result Value Range Status   Specimen Description ABSCESS FOREARM RIGHT   Final   Special Requests NONE   Final   Gram Stain     Final   Value: MODERATE WBC PRESENT,BOTH PMN AND MONONUCLEAR     NO SQUAMOUS EPITHELIAL CELLS SEEN     RARE GRAM POSITIVE COCCI IN PAIRS     Performed at [facility]   Culture     Final   Value: NO ANAEROBES ISOLATED; CULTURE IN PROGRESS FOR 5 DAYS     Performed at [facility]   Report Status PENDING   Incomplete     Studies:  Recent x-ray studies have been reviewed in detail by the Attending Physician  Time spent :      Junious Silk, ANP [facility] Office  [phone] Pager [phone]  **If unable to reach the above provider after paging please contact the Flow Manager @ [phone]  On-Call/Text Page:      [email or web address]      password [email or web address]  If 7PM-7AM, please contact night-coverage [email or web address] Password [email or web address] 03/15/2013, 12:25 PM   LOS: 5 days   I have personally examined this patient and reviewed the entire database. I have reviewed the above note, made any necessary editorial changes, and agree with its content.  Lonia Blood, MD [facility]

## 2013-03-15 NOTE — Progress Notes (Signed)
Inpatient Diabetes Program Recommendations  AACE/ADA: New Consensus Statement on Inpatient Glycemic Control (2013)  Target Ranges:  Prepandial:   less than 140 mg/dL      Peak postprandial:   less than 180 mg/dL (1-2 hours)      Critically ill patients:  140 - 180 mg/dL  Results for Jay Snyder, Jay Snyder (MRN [id number]) as of 03/15/2013 10:19  Ref. Range 03/14/2013 12:26 03/14/2013 17:09 03/14/2013 21:00 03/15/2013 05:31 03/15/2013 07:30  Glucose-Capillary Latest Range: 70-99 mg/dL 308259 (H) 657239 (H) 846234 (H) 224 (H) 203 (H)    Inpatient Diabetes Program Recommendations Insulin - Basal: consider increasing Levemir to 15 units  Correction (SSI): Please change Novolog to TID + HS  HgbA1C: =9.8  Thank you  Piedad ClimesGina Sharlyne Koeneman BSN, RN,CDE Inpatient Diabetes Coordinator [phone] (team pager)

## 2013-03-15 NOTE — Progress Notes (Signed)
ANTIBIOTIC CONSULT NOTE - INITIAL  Pharmacy Consult for Vancomycin Indication: cellulitis  No Known Allergies  Patient Measurements: Height: 5\' 11"  (180.3 cm) Weight: 232 lb 12.9 oz (105.6 kg) IBW/kg (Calculated) : 75.3  Vital Signs: Temp: 97.8 F (36.6 C) (02/06 0417) Temp src: Oral (02/06 0417) BP: 177/74 mmHg (02/06 0417) Pulse Rate: 98 (02/06 0417) Intake/Output from previous day: 02/05 0701 - 02/06 0700 In: 138 [I.V.:138] Out: 1651 [Urine:1650; Stool:1] Intake/Output from this shift:    Labs:  Recent Labs  03/13/13 0856 03/14/13 0338  WBC 27.4* 26.0*  HGB 10.4* 9.4*  PLT 392 410*  CREATININE 0.68 0.65   Estimated Creatinine Clearance: 166.9 ml/min (by C-G formula based on Cr of 0.65).  Recent Labs  03/15/13 0500  VANCOTROUGH <5.0*     Microbiology: Recent Results (from the past 720 hour(s))  CULTURE, BLOOD (SINGLE)     Status: None   Collection Time    03/10/13  6:10 PM      Result Value Range Status   Specimen Description BLOOD RIGHT ARM   Final   Special Requests BOTTLES DRAWN AEROBIC AND ANAEROBIC 5CC EACH   Final   Culture  Setup Time     Final   Value: 03/11/2013 02:15     Performed at [facility]   Culture     Final   Value:        BLOOD CULTURE RECEIVED NO GROWTH TO DATE CULTURE WILL BE HELD FOR 5 DAYS BEFORE ISSUING A FINAL NEGATIVE REPORT     Performed at [facility]   Report Status PENDING   Incomplete  MRSA PCR SCREENING     Status: None   Collection Time    03/10/13 11:07 PM      Result Value Range Status   MRSA by PCR NEGATIVE  NEGATIVE Final   Comment:            The GeneXpert MRSA Assay (FDA     approved for NASAL specimens     only), is one component of a     comprehensive MRSA colonization     surveillance program. It is not     intended to diagnose MRSA     infection nor to guide or     monitor treatment for     MRSA infections.  CULTURE, BLOOD (ROUTINE X 2)     Status: None   Collection Time   03/11/13  3:10 PM      Result Value Range Status   Specimen Description BLOOD RIGHT HAND   Final   Special Requests BOTTLES DRAWN AEROBIC ONLY 3CC   Final   Culture  Setup Time     Final   Value: 03/11/2013 21:41     Performed at [facility]   Culture     Final   Value:        BLOOD CULTURE RECEIVED NO GROWTH TO DATE CULTURE WILL BE HELD FOR 5 DAYS BEFORE ISSUING A FINAL NEGATIVE REPORT     Performed at [facility]   Report Status PENDING   Incomplete  CULTURE, BLOOD (ROUTINE X 2)     Status: None   Collection Time    03/11/13  4:05 PM      Result Value Range Status   Specimen Description BLOOD RIGHT HAND   Final   Special Requests BOTTLES DRAWN AEROBIC ONLY 4CC   Final   Culture  Setup Time     Final   Value:  03/11/2013 21:40     [facility]   Culture     Final   Value:        BLOOD CULTURE RECEIVED NO GROWTH TO DATE CULTURE WILL BE HELD FOR 5 DAYS BEFORE ISSUING A FINAL NEGATIVE REPORT     [facility]   Report Status PENDING   Incomplete  GRAM STAIN     Status: None   Collection Time    03/12/13  5:20 PM      Result Value Range Status   Specimen Description ABSCESS LEFT ARM   Final   Special Requests DIFFUSE ARM ABSCESS   Final   Gram Stain     Final   Value: ABUNDANT WBC PRESENT,BOTH PMN AND MONONUCLEAR     FEW GRAM POSITIVE COCCI IN PAIRS IN CLUSTERS     Gram Stain Report Called to,Read Back By and Verified With: Gates RiggM EVANGELISTA 1914 03/12/13 A BROWNING   Report Status 03/12/2013 FINAL   Final  ANAEROBIC CULTURE     Status: None   Collection Time    03/12/13  5:20 PM      Result Value Range Status   Specimen Description ABSCESS LEFT ARM   Final   Special Requests NONE  DIFFUSE LEFT ARM ABSCESS   Final   Gram Stain     Final   Value: ABUNDANT WBC PRESENT,BOTH PMN AND MONONUCLEAR     RARE SQUAMOUS EPITHELIAL CELLS PRESENT     FEW GRAM POSITIVE COCCI     IN PAIRS IN CLUSTERS [facility]      [facility]   Culture     Final   Value: NO ANAEROBES ISOLATED     Note: Gram Stain Report Called to,Read Back By and Verified With: Gates RiggM EVANGELISTA 1914 03/12/13 A BROWNING     [facility]   Report Status PENDING   Incomplete  CULTURE, ROUTINE-ABSCESS     Status: None   Collection Time    03/12/13  5:20 PM      Result Value Range Status   Specimen Description ABSCESS LEFT ARM   Final   Special Requests DIFFUSE ARM ABSCESS   Final   Gram Stain     Final   Value: ABUNDANT WBC PRESENT,BOTH PMN AND MONONUCLEAR     RARE SQUAMOUS EPITHELIAL CELLS PRESENT     FEW GRAM POSITIVE COCCI     IN PAIRS IN CLUSTERS [facility] Gram Stain Report Called to,Read Back By and Verified With: Gram Stain Report Called to,Read Back By and Verified With: Gates RiggM EVANGELISTA 1914 03/12/13 A BROWNING     [facility]   Culture     Final   Value: MODERATE STAPHYLOCOCCUS AUREUS     Note: RIFAMPIN AND GENTAMICIN SHOULD NOT BE USED AS SINGLE DRUGS FOR TREATMENT OF STAPH INFECTIONS.     [facility]   Report Status PENDING   Incomplete  CULTURE, ROUTINE-ABSCESS     Status: None   Collection Time    03/12/13  6:34 PM      Result Value Range Status   Specimen Description ABSCESS FOREARM RIGHT   Final   Special Requests NONE   Final   Gram Stain     Final   Value: MODERATE WBC PRESENT,BOTH PMN AND MONONUCLEAR     NO SQUAMOUS EPITHELIAL CELLS SEEN     NO ORGANISMS SEEN  Performed at [facility]     Final   Value: FEW STAPHYLOCOCCUS AUREUS     Note: RIFAMPIN AND GENTAMICIN SHOULD NOT BE USED AS SINGLE DRUGS FOR TREATMENT OF STAPH INFECTIONS.     Performed at [facility]   Report Status PENDING   Incomplete  ANAEROBIC CULTURE     Status: None   Collection Time    03/12/13  6:34 PM      Result Value Range Status   Specimen Description ABSCESS FOREARM RIGHT   Final   Special Requests  NONE   Final   Gram Stain     Final   Value: MODERATE WBC PRESENT,BOTH PMN AND MONONUCLEAR     NO SQUAMOUS EPITHELIAL CELLS SEEN     RARE GRAM POSITIVE COCCI IN PAIRS     Performed at [facility]   Culture     Final   Value: NO ANAEROBES ISOLATED; CULTURE IN PROGRESS FOR 5 DAYS     Performed at [facility]   Report Status PENDING   Incomplete   Assessment: 33 yo male with h/o IVDA, upper extremity MSSA cellulitis/abcesses, for vancomycin  Goal of Therapy:  Vancomycin trough level 10-15 mcg/ml  Plan:  Change vancomycin 1250 mg IV q8g  Daysy Santini, Gary Fleet 03/15/2013,5:58 AM

## 2013-03-15 NOTE — Progress Notes (Signed)
Physical Therapy Treatment Patient Details Name: Phylliss BobJeremy Scearce MRN: [id number] DOB: [birth year] Today's Date: 03/15/2013 Time: [id number] PT Time Calculation (min): 25 min  PT Assessment / Plan / Recommendation  History of Present Illness Pt admit with cellulitis and abscess bil forearms due to cocaine injected in forearms.  I&D bil forearms.  left UE worse than right.     PT Comments   Pt admitted with above. Pt currently with functional limitations due to balance and endurance deficits. Pt will benefit from skilled PT to increase their independence and safety with mobility to allow discharge to the venue listed below.   Follow Up Recommendations  Supervision/Assistance - 24 hour;No PT follow up                 Equipment Recommendations  Other (comment) (TBA)    Recommendations for Other Services OT consult  Frequency Min 3X/week   Progress towards PT Goals Progress towards PT goals: Progressing toward goals  Plan Discharge plan needs to be updated    Precautions / Restrictions Precautions Precautions: Fall Restrictions Weight Bearing Restrictions: No Other Position/Activity Restrictions: LE in suspended sling, uses 3 pillows when pt's fingers become tingly and then return to sling.   Pertinent Vitals/Pain VSS, Bil UE  pain    Mobility  Bed Mobility Overal bed mobility: Needs Assistance Bed Mobility: Supine to Sit Supine to sit: Min assist General bed mobility comments: assist to right trunk for supine to sit Transfers Overall transfer level: Needs assistance Equipment used: None Transfers: Sit to/from Stand Sit to Stand: Min guard General transfer comment: needs min guard assist using right hand to push up. Ambulation/Gait Ambulation/Gait assistance: Min guard Ambulation Distance (Feet): 450 Feet Assistive device: None Gait Pattern/deviations: Step-through pattern Gait velocity interpretation: <1.8 ft/sec, indicative of risk for recurrent falls General Gait Details:  Overall steady gait.  Pt at times iwth guarded gait due to pain in hands and trying to maneuver around objects.      Exercises General Exercises - Lower Extremity Ankle Circles/Pumps: AROM;10 reps;Both;Supine Quad Sets: AROM;Both;10 reps;Supine   PT Goals (current goals can now be found in the care plan section)    Visit Information  Last PT Received On: 03/15/13 Assistance Needed: +1 History of Present Illness: Pt admit with cellulitis and abscess bil forearms due to cocaine injected in forearms.  I&D bil forearms.  left UE worse than right.      Subjective Data  Subjective: "I want to walk."   Cognition  Cognition Arousal/Alertness: Awake/alert Behavior During Therapy: WFL for tasks assessed/performed Overall Cognitive Status: Within Functional Limits for tasks assessed    Balance  Balance Overall balance assessment: Needs assistance Standing balance support: No upper extremity supported;During functional activity Standing balance-Leahy Scale: Fair  End of Session PT - End of Session Equipment Utilized During Treatment: Gait belt Activity Tolerance: Patient limited by fatigue;Patient limited by pain Patient left: in chair;with call bell/phone within reach;with family/visitor present Nurse Communication: Mobility status        INGOLD,Sharmon Cheramie 03/15/2013, 12:54 PM [facility],PT Acute Rehabilitation [phone] (pager)

## 2013-03-15 NOTE — Progress Notes (Signed)
ANTIBIOTIC CONSULT NOTE - INITIAL  Pharmacy Consult for Vancomycin Indication: rule out sepsis / LUE cellulitis / abcess  No Known Allergies  Patient Measurements: Height: 5\' 11"  (180.3 cm) Weight: 232 lb 12.9 oz (105.6 kg) IBW/kg (Calculated) : 75.3   Vital Signs: Temp: 98.7 F (37.1 C) (02/06 1609) Temp src: Oral (02/06 1609) BP: 189/65 mmHg (02/06 1609) Pulse Rate: 102 (02/06 1800) Intake/Output from previous day: 02/05 0701 - 02/06 0700 In: 298 [I.V.:298] Out: 1651 [Urine:1650; Stool:1] Intake/Output from this shift:    Labs:  Recent Labs  03/13/13 0856 03/14/13 0338 03/15/13 0821  WBC 27.4* 26.0* 20.4*  HGB 10.4* 9.4* 9.8*  PLT 392 410* 523*  CREATININE 0.68 0.65  --    Estimated Creatinine Clearance: 166.9 ml/min (by C-G formula based on Cr of 0.65).  Recent Labs  03/15/13 0500  VANCOTROUGH <5.0*     Microbiology: Recent Results (from the past 720 hour(s))  CULTURE, BLOOD (SINGLE)     Status: None   Collection Time    03/10/13  6:10 PM      Result Value Range Status   Specimen Description BLOOD RIGHT ARM   Final   Special Requests BOTTLES DRAWN AEROBIC AND ANAEROBIC 5CC EACH   Final   Culture  Setup Time     Final   Value: 03/11/2013 02:15     Performed at [facility]   Culture     Final   Value:        BLOOD CULTURE RECEIVED NO GROWTH TO DATE CULTURE WILL BE HELD FOR 5 DAYS BEFORE ISSUING A FINAL NEGATIVE REPORT     Performed at [facility]   Report Status PENDING   Incomplete  MRSA PCR SCREENING     Status: None   Collection Time    03/10/13 11:07 PM      Result Value Range Status   MRSA by PCR NEGATIVE  NEGATIVE Final   Comment:            The GeneXpert MRSA Assay (FDA     approved for NASAL specimens     only), is one component of a     comprehensive MRSA colonization     surveillance program. It is not     intended to diagnose MRSA     infection nor to guide or     monitor treatment for     MRSA infections.   CULTURE, BLOOD (ROUTINE X 2)     Status: None   Collection Time    03/11/13  3:10 PM      Result Value Range Status   Specimen Description BLOOD RIGHT HAND   Final   Special Requests BOTTLES DRAWN AEROBIC ONLY 3CC   Final   Culture  Setup Time     Final   Value: 03/11/2013 21:41     Performed at [facility]   Culture     Final   Value:        BLOOD CULTURE RECEIVED NO GROWTH TO DATE CULTURE WILL BE HELD FOR 5 DAYS BEFORE ISSUING A FINAL NEGATIVE REPORT     Performed at [facility]   Report Status PENDING   Incomplete  CULTURE, BLOOD (ROUTINE X 2)     Status: None   Collection Time    03/11/13  4:05 PM      Result Value Range Status   Specimen Description BLOOD RIGHT HAND   Final   Special Requests BOTTLES DRAWN AEROBIC ONLY  4CC   Final   Culture  Setup Time     Final   Value: 03/11/2013 21:40     Performed at [facility]   Culture     Final   Value:        BLOOD CULTURE RECEIVED NO GROWTH TO DATE CULTURE WILL BE HELD FOR 5 DAYS BEFORE ISSUING A FINAL NEGATIVE REPORT     Performed at [facility]   Report Status PENDING   Incomplete  GRAM STAIN     Status: None   Collection Time    03/12/13  5:20 PM      Result Value Range Status   Specimen Description ABSCESS LEFT ARM   Final   Special Requests DIFFUSE ARM ABSCESS   Final   Gram Stain     Final   Value: ABUNDANT WBC PRESENT,BOTH PMN AND MONONUCLEAR     FEW GRAM POSITIVE COCCI IN PAIRS IN CLUSTERS     Gram Stain Report Called to,Read Back By and Verified With: Gates Rigg 1914 03/12/13 A BROWNING   Report Status 03/12/2013 FINAL   Final  ANAEROBIC CULTURE     Status: None   Collection Time    03/12/13  5:20 PM      Result Value Range Status   Specimen Description ABSCESS LEFT ARM   Final   Special Requests NONE  DIFFUSE LEFT ARM ABSCESS   Final   Gram Stain     Final   Value: ABUNDANT WBC PRESENT,BOTH PMN AND MONONUCLEAR     RARE SQUAMOUS EPITHELIAL CELLS PRESENT     FEW GRAM POSITIVE  COCCI     IN PAIRS IN CLUSTERS Performed at [facility]     Performed at [facility]   Culture     Final   Value: NO ANAEROBES ISOLATED     Note: Gram Stain Report Called to,Read Back By and Verified With: Gates Rigg 1914 03/12/13 A BROWNING     Performed at [facility]   Report Status PENDING   Incomplete  CULTURE, ROUTINE-ABSCESS     Status: None   Collection Time    03/12/13  5:20 PM      Result Value Range Status   Specimen Description ABSCESS LEFT ARM   Final   Special Requests DIFFUSE ARM ABSCESS   Final   Gram Stain     Final   Value: ABUNDANT WBC PRESENT,BOTH PMN AND MONONUCLEAR     RARE SQUAMOUS EPITHELIAL CELLS PRESENT     FEW GRAM POSITIVE COCCI     IN PAIRS IN CLUSTERS Performed at [facility] Gram Stain Report Called to,Read Back By and Verified With: Gram Stain Report Called to,Read Back By and Verified With: Gates Rigg 1914 03/12/13 A BROWNING     Performed at [facility]   Culture     Final   Value: MODERATE STAPHYLOCOCCUS AUREUS     Note: RIFAMPIN AND GENTAMICIN SHOULD NOT BE USED AS SINGLE DRUGS FOR TREATMENT OF STAPH INFECTIONS. This organism DOES NOT demonstrate inducible Clindamycin resistance in vitro.     Performed at [facility]   Report Status 03/15/2013 FINAL   Final   Organism ID, Bacteria STAPHYLOCOCCUS AUREUS   Final  CULTURE, ROUTINE-ABSCESS     Status: None   Collection Time    03/12/13  6:34 PM      Result Value Range Status   Specimen Description ABSCESS FOREARM RIGHT   Final   Special  Requests NONE   Final   Gram Stain     Final   Value: MODERATE WBC PRESENT,BOTH PMN AND MONONUCLEAR     NO SQUAMOUS EPITHELIAL CELLS SEEN     NO ORGANISMS SEEN     [facility]   Culture     Final   Value: FEW STAPHYLOCOCCUS AUREUS     Note: RIFAMPIN AND GENTAMICIN SHOULD NOT BE USED AS SINGLE DRUGS FOR TREATMENT OF STAPH INFECTIONS. This organism DOES NOT demonstrate inducible Clindamycin  resistance in vitro.     Performed at [facility]   Report Status 03/15/2013 FINAL   Final   Organism ID, Bacteria STAPHYLOCOCCUS AUREUS   Final  ANAEROBIC CULTURE     Status: None   Collection Time    03/12/13  6:34 PM      Result Value Range Status   Specimen Description ABSCESS FOREARM RIGHT   Final   Special Requests NONE   Final   Gram Stain     Final   Value: MODERATE WBC PRESENT,BOTH PMN AND MONONUCLEAR     NO SQUAMOUS EPITHELIAL CELLS SEEN     RARE GRAM POSITIVE COCCI IN PAIRS     [facility]formed at [facility]   Culture     Final   Value: NO ANAEROBES ISOLATED; CULTURE IN PROGRESS FOR 5 DAYS     [facility]formed at [facility]   Report Status PENDING   Incomplete    Medical History: Past Medical History  Diagnosis Date  . Diabetes mellitus without complication      Assessment: 32yom admitted with fever 102.2, WBC 27.4 and LUE cellulitis / abcess.  Blood Cx still in procsess, abcess grew MSSA.  Continue broad spectrum ABX until final cx back.    Goal of Therapy:  Vancomycin trough 15-20  Plan:  Vancomycin 1gm IV q8  Leota SauersLisa Doyal Saric Pharm.D. CPP, BCPS Clinical Pharmacist [phone] 03/15/2013 7:16 PM

## 2013-03-16 DIAGNOSIS — D72829 Elevated white blood cell count, unspecified: Secondary | ICD-10-CM

## 2013-03-16 LAB — CBC
HCT: 26.6 % — ABNORMAL LOW (ref 39.0–52.0)
Hemoglobin: 9.1 g/dL — ABNORMAL LOW (ref 13.0–17.0)
MCH: 28.1 pg (ref 26.0–34.0)
MCHC: 34.2 g/dL (ref 30.0–36.0)
MCV: 82.1 fL (ref 78.0–100.0)
PLATELETS: 545 10*3/uL — AB (ref 150–400)
RBC: 3.24 MIL/uL — ABNORMAL LOW (ref 4.22–5.81)
RDW: 12.7 % (ref 11.5–15.5)
WBC: 16.3 10*3/uL — AB (ref 4.0–10.5)

## 2013-03-16 LAB — BASIC METABOLIC PANEL
BUN: 11 mg/dL (ref 6–23)
CALCIUM: 8.3 mg/dL — AB (ref 8.4–10.5)
CO2: 24 mEq/L (ref 19–32)
Chloride: 102 mEq/L (ref 96–112)
Creatinine, Ser: 0.71 mg/dL (ref 0.50–1.35)
GFR calc non Af Amer: >90 mL/min (ref >90)
Glucose, Bld: 137 mg/dL — ABNORMAL HIGH (ref 70–99)
Potassium: 4 mEq/L (ref 3.7–5.3)
Sodium: 138 mEq/L (ref 137–147)

## 2013-03-16 LAB — GLUCOSE, CAPILLARY
Glucose-Capillary: 115 mg/dL — ABNORMAL HIGH (ref 70–99)
Glucose-Capillary: 168 mg/dL — ABNORMAL HIGH (ref 70–99)
Glucose-Capillary: 181 mg/dL — ABNORMAL HIGH (ref 70–99)
Glucose-Capillary: 182 mg/dL — ABNORMAL HIGH (ref 70–99)
Glucose-Capillary: 220 mg/dL — ABNORMAL HIGH (ref 70–99)
Glucose-Capillary: 233 mg/dL — ABNORMAL HIGH (ref 70–99)

## 2013-03-16 MED ORDER — AMLODIPINE BESYLATE 5 MG PO TABS
5.0000 mg | ORAL_TABLET | Freq: Every day | ORAL | Status: DC
  Administered 2013-03-16 – 2013-03-17 (×2): 5 mg via ORAL
  Filled 2013-03-16 (×2): qty 1

## 2013-03-16 MED ORDER — SODIUM CHLORIDE 0.9 % IJ SOLN
10.0000 mL | INTRAMUSCULAR | Status: DC | PRN
  Administered 2013-03-16 – 2013-03-17 (×2): 20 mL

## 2013-03-16 NOTE — Progress Notes (Signed)
Occupational Therapy Treatment Patient Details Name: Jay Snyder MRN: [id number] DOB: 09-23-1980 Today's Date: 03/16/2013 Time: 4098-1191 OT Time Calculation (min): 14 min  OT Assessment / Plan / Recommendation  History of present illness Pt admit with cellulitis and abscess bil forearms due to cocaine injected in forearms.  I&D bil forearms.  left UE worse than right.     OT comments  Pt with significantly improved AROM, edema and functional use of bil. UEs.  Pt is now able to perform BADLs with supervision to occasional min A (fiance' will assist as needed).  Reinforced edema control and need to perform AROM. Pt verbalized agreement   Follow Up Recommendations  No OT follow up    Barriers to Discharge       Equipment Recommendations  None recommended by OT    Recommendations for Other Services    Frequency Min 2X/week   Progress towards OT Goals Progress towards OT goals: Progressing toward goals  Plan Discharge plan remains appropriate    Precautions / Restrictions     Pertinent Vitals/Pain     ADL  Eating/Feeding: Set up Where Assessed - Eating/Feeding: Chair;Edge of bed Grooming: Wash/dry hands;Wash/dry face;Teeth care;Brushing hair;Supervision/safety Where Assessed - Grooming: Unsupported standing Upper Body Bathing: Moderate assistance Where Assessed - Upper Body Bathing: Unsupported sitting Lower Body Bathing: Supervision/safety Where Assessed - Lower Body Bathing: Unsupported sit to stand Upper Body Dressing: Supervision/safety Where Assessed - Upper Body Dressing: Unsupported sitting Lower Body Dressing: Supervision/safety Where Assessed - Lower Body Dressing: Unsupported sit to stand Toilet Transfer: Supervision/safety Toilet Transfer Method: Sit to stand;Stand pivot Acupuncturist: Comfort height toilet Toileting - Clothing Manipulation and Hygiene: Supervision/safety Where Assessed - Toileting Clothing Manipulation and Hygiene:  Standing Transfers/Ambulation Related to ADLs: S ADL Comments: Pt now able to don/doff socks modified independently. And, able to access peri area for hygiene.  He reports he has been able to feed self     OT Diagnosis:    OT Problem List:   OT Treatment Interventions:     OT Goals(current goals can now be found in the care plan section) ADL Goals Pt Will Perform Eating: with adaptive utensils;sitting;with modified independence Pt Will Perform Grooming: standing;with modified independence Pt Will Perform Upper Body Bathing: sitting;with modified independence Pt Will Perform Lower Body Bathing: sit to/from stand;with modified independence Pt Will Perform Upper Body Dressing: sitting;with modified independence Pt Will Perform Lower Body Dressing: sit to/from stand;with modified independence Pt Will Transfer to Toilet: with supervision;ambulating;regular height toilet Pt Will Perform Toileting - Clothing Manipulation and hygiene: with modified independence;sit to/from stand Pt/caregiver will Perform Home Exercise Program: Both right and left upper extremity;Independently Additional ADL Goal #1: Pt will be knowledgeable in edema management techniques including elevation, retrograde massage, and AROM. Additional ADL Goal #2: Pt will perform bed mobility independently in preparation for ADL.  Visit Information  Last OT Received On: 03/16/13 Assistance Needed: +1 History of Present Illness: Pt admit with cellulitis and abscess bil forearms due to cocaine injected in forearms.  I&D bil forearms.  left UE worse than right.      Subjective Data      Prior Functioning       Cognition  Cognition Arousal/Alertness: Awake/alert Behavior During Therapy: WFL for tasks assessed/performed Overall Cognitive Status: Within Functional Limits for tasks assessed    Mobility  Bed Mobility Overal bed mobility: Modified Independent Transfers Overall transfer level: Needs assistance Equipment used:  None Transfers: Sit to/from [employer] Transfers Sit to Stand: Supervision  Stand pivot transfers: Supervision    Exercises  General Exercises - Upper Extremity Elbow Flexion: AROM;Right;Left;10 reps;Seated (Lt. limted by bulky dressing) Elbow Extension: AROM;Right;Left;10 reps;Seated Wrist Flexion: AROM;Right;Left;10 reps;Seated Wrist Extension: AROM;Right;Left;10 reps;Seated Digit Composite Flexion: AROM;Right;Left;10 reps;Seated General Exercises - Lower Extremity Ankle Circles/Pumps: AROM;Right;Left;10 reps;Seated Hand Exercises Forearm Supination: AROM;Right;Left;10 reps (Lt. with supination limited ~20*) Forearm Pronation: AROM;Right;Left;10 reps;Seated Other Exercises Other Exercises: Reinforced edema control;  Also instructed pt to perform AROM bil. UEs multiple times of day and rationale for doing so. He verbalized understanding    Balance    End of Session OT - End of Session Activity Tolerance: Patient tolerated treatment well Patient left: in bed;with call bell/phone within reach;with family/visitor present  GO     [name], Ursula AlertWendi M 03/16/2013, 6:51 PM

## 2013-03-16 NOTE — Progress Notes (Signed)
[facility] PROGRESS NOTE  Jay Snyder AVW:[id number] DOB: 11-Oct-1980 DOA: 03/10/2013 PCP: No primary provider on file.  Assessment/Plan: 33 year old male patient known diabetic on metformin at home. Presented with a 3-4 day history of severe left arm swelling. Patient developed the symptoms after injecting cocaine into his arm. Also endorsed fever up to 102.2.   1. Sepsis  -Sepsis physiology resolved  -Source upper extremity cellulitis with abscesses  -Intraoperative wound cultures positive for staph aureus-cont IV anbx's while inpatient then transition to PO- keep in mind pt is self pay and has to pay for meds OOP  -WBC with slow downward trend  -Blood cultures no growth to date  2. Cellulitis and multifocal abscesses / Edema of upper extremity  -MRI left upper extremity revealed abscesses extending from the antecubital region primarily involving the pronator teres muscle as well as myositis of the pronator teres, supinator, brachial radialis, proximal extensor musculature and to a lesser extent flexor carpi radialis as well as confirmed extensive cellulitis and forearm.  -now s/p I/D with muscle and soft tissue/skin debridement 2/3-per orthopedic surgeon no further surgical intervention required  -Treat pain with Tylenol, morphine, oxycodone and Toradol  3. Limited IV access/IVDA  -likely due to IVD use + sepsis - unable to obtain R arm PICC due to fear of local cellulitis  -PCCM placed CVL 2/2  -pt counseled by attending MD re need complete cessation of illicit drugs  4. Diabetes mellitus type 2, uncontrolled  -Began low-dose Levemir with moderate sliding scale insulin 2/2  -Hemoglobin A1c 9.8  -Current CBG is well controlled  5. HTN  -patient has been told in the past that he had HTN but he was not on treatment  -Suspect pain contributing  -up at times and may need to begin meds before dc- has not followed up with PCP in "some time" -start amlodipine   Code Status:  full Family Communication: d/w patient, his fiance  (indicate person spoken with, relationship, and if by phone, the number) Disposition Plan: home 24-48 hours    Consultants:  ortho  Procedures:  I&D  Procedures:  03/12/12 per Dr. Roda Shutters:  1. Incision and drainage of left upper extremity abscesses.  2. Debridement of muscle, and soft tissue, skin of left arm. 10 x 5 cm  3. Incision and drainage of right antecubital fossa abscess  4. Debridement of muscle, soft tissue, skin of right arm. 5 x 5 cm  Antibiotics:  Zosyn 2/1 >>>  Vancomycin 2/1 >>>  Oral Levaquin 2/2 >>2/04   HPI/Subjective: alert  Objective: Filed Vitals:   03/16/13 0815  BP: 165/69  Pulse: 106  Temp: 98.3 F (36.8 C)  Resp: 24    Intake/Output Summary (Last 24 hours) at 03/16/13 0932 Last data filed at 03/16/13 0545  Gross per 24 hour  Intake    440 ml  Output   1675 ml  Net  -1235 ml   Filed Weights   03/10/13 1723 03/10/13 2307  Weight: 101.606 kg (224 lb) 105.6 kg (232 lb 12.9 oz)    Exam:   General:  alert  Cardiovascular: s1,s2 rrr  Respiratory: CTA BL  Abdomen: soft, nt, nd   Musculoskeletal: no LE edema   Data Reviewed: Basic Metabolic Panel:  Recent Labs Lab 03/10/13 1810 03/11/13 0130 03/12/13 0440 03/13/13 0856 03/14/13 0338 03/16/13 0445  NA 134* 132* 138 137 141 138  K 4.0 3.5* 3.6* 3.8 3.9 4.0  CL 95* 95* 99 99 104 102  CO2 22 22 24  26 20 24   GLUCOSE 491* 192* 148* 173* 127* 137*  BUN 17 11 11 9 10 11   CREATININE 0.80 0.64 0.63 0.68 0.65 0.71  CALCIUM 9.7 8.8 8.1* 7.8* 8.0* 8.3*  MG  --  1.7  --   --   --   --   PHOS  --  2.3  --   --   --   --    Liver Function Tests:  Recent Labs Lab 03/11/13 0130 03/12/13 0440  AST 34 33  ALT 31 32  ALKPHOS 181* 259*  BILITOT 0.2* 0.2*  PROT 6.5 6.4  ALBUMIN 2.1* 2.1*   No results found for this basename: LIPASE, AMYLASE,  in the last 168 hours No results found for this basename: AMMONIA,  in the last 168  hours CBC:  Recent Labs Lab 03/10/13 1810  03/12/13 0440 03/13/13 0856 03/14/13 0338 03/15/13 0821 03/16/13 0445  WBC 33.0*  < > 30.9* 27.4* 26.0* 20.4* 16.3*  NEUTROABS 29.0*  --   --   --   --   --   --   HGB 13.9  < > 12.8* 10.4* 9.4* 9.8* 9.1*  HCT 41.1  < > 37.0* 30.7* 27.5* 27.8* 26.6*  MCV 83.5  < > 83.1 82.7 83.1 82.7 82.1  PLT 302  < > 347 392 410* 523* 545*  < > = values in this interval not displayed. Cardiac Enzymes: No results found for this basename: CKTOTAL, CKMB, CKMBINDEX, TROPONINI,  in the last 168 hours BNP (last 3 results) No results found for this basename: PROBNP,  in the last 8760 hours CBG:  Recent Labs Lab 03/15/13 1613 03/15/13 2040 03/16/13 0018 03/16/13 0358 03/16/13 0813  GLUCAP 224* 213* 181* 115* 233*    Recent Results (from the past 240 hour(s))  CULTURE, BLOOD (SINGLE)     Status: None   Collection Time    03/10/13  6:10 PM      Result Value Range Status   Specimen Description BLOOD RIGHT ARM   Final   Special Requests BOTTLES DRAWN AEROBIC AND ANAEROBIC 5CC EACH   Final   Culture  Setup Time     Final   Value: 03/11/2013 02:15     Performed at [facility]   Culture     Final   Value:        BLOOD CULTURE RECEIVED NO GROWTH TO DATE CULTURE WILL BE HELD FOR 5 DAYS BEFORE ISSUING A FINAL NEGATIVE REPORT     Performed at [facility]   Report Status PENDING   Incomplete  MRSA PCR SCREENING     Status: None   Collection Time    03/10/13 11:07 PM      Result Value Range Status   MRSA by PCR NEGATIVE  NEGATIVE Final   Comment:            The GeneXpert MRSA Assay (FDA     approved for NASAL specimens     only), is one component of a     comprehensive MRSA colonization     surveillance program. It is not     intended to diagnose MRSA     infection nor to guide or     monitor treatment for     MRSA infections.  CULTURE, BLOOD (ROUTINE X 2)     Status: None   Collection Time    03/11/13  3:10 PM      Result  Value Range Status   Specimen  Description BLOOD RIGHT HAND   Final   Special Requests BOTTLES DRAWN AEROBIC ONLY 3CC   Final   Culture  Setup Time     Final   Value: 03/11/2013 21:41     Performed at [facility]   Culture     Final   Value:        BLOOD CULTURE RECEIVED NO GROWTH TO DATE CULTURE WILL BE HELD FOR 5 DAYS BEFORE ISSUING A FINAL NEGATIVE REPORT     Performed at [facility]   Report Status PENDING   Incomplete  CULTURE, BLOOD (ROUTINE X 2)     Status: None   Collection Time    03/11/13  4:05 PM      Result Value Range Status   Specimen Description BLOOD RIGHT HAND   Final   Special Requests BOTTLES DRAWN AEROBIC ONLY 4CC   Final   Culture  Setup Time     Final   Value: 03/11/2013 21:40     Performed at [facility]   Culture     Final   Value:        BLOOD CULTURE RECEIVED NO GROWTH TO DATE CULTURE WILL BE HELD FOR 5 DAYS BEFORE ISSUING A FINAL NEGATIVE REPORT     Performed at [facility]   Report Status PENDING   Incomplete  GRAM STAIN     Status: None   Collection Time    03/12/13  5:20 PM      Result Value Range Status   Specimen Description ABSCESS LEFT ARM   Final   Special Requests DIFFUSE ARM ABSCESS   Final   Gram Stain     Final   Value: ABUNDANT WBC PRESENT,BOTH PMN AND MONONUCLEAR     FEW GRAM POSITIVE COCCI IN PAIRS IN CLUSTERS     Gram Stain Report Called to,Read Back By and Verified With: Gates Rigg 1914 03/12/13 A BROWNING   Report Status 03/12/2013 FINAL   Final  ANAEROBIC CULTURE     Status: None   Collection Time    03/12/13  5:20 PM      Result Value Range Status   Specimen Description ABSCESS LEFT ARM   Final   Special Requests NONE  DIFFUSE LEFT ARM ABSCESS   Final   Gram Stain     Final   Value: ABUNDANT WBC PRESENT,BOTH PMN AND MONONUCLEAR     RARE SQUAMOUS EPITHELIAL CELLS PRESENT     FEW GRAM POSITIVE COCCI     IN PAIRS IN CLUSTERS Performed at [facility]     Performed at [facility]   Culture     Final   Value: NO ANAEROBES ISOLATED     Note: Gram Stain Report Called to,Read Back By and Verified With: Gates Rigg 1914 03/12/13 A BROWNING     Performed at [facility]   Report Status PENDING   Incomplete  CULTURE, ROUTINE-ABSCESS     Status: None   Collection Time    03/12/13  5:20 PM      Result Value Range Status   Specimen Description ABSCESS LEFT ARM   Final   Special Requests DIFFUSE ARM ABSCESS   Final   Gram Stain     Final   Value: ABUNDANT WBC PRESENT,BOTH PMN AND MONONUCLEAR     RARE SQUAMOUS EPITHELIAL CELLS PRESENT     FEW GRAM POSITIVE COCCI     IN PAIRS IN CLUSTERS Performed at [facility]  Gram Stain Report Called to,Read Back By and Verified With: Gram Stain Report Called to,Read Back By and Verified With: Gates Rigg 1914 03/12/13 A BROWNING     Performed at [facility]   Culture     Final   Value: MODERATE STAPHYLOCOCCUS AUREUS     Note: RIFAMPIN AND GENTAMICIN SHOULD NOT BE USED AS SINGLE DRUGS FOR TREATMENT OF STAPH INFECTIONS. This organism DOES NOT demonstrate inducible Clindamycin resistance in vitro.     Performed at [facility]   Report Status 03/15/2013 FINAL   Final   Organism ID, Bacteria STAPHYLOCOCCUS AUREUS   Final  CULTURE, ROUTINE-ABSCESS     Status: None   Collection Time    03/12/13  6:34 PM      Result Value Range Status   Specimen Description ABSCESS FOREARM RIGHT   Final   Special Requests NONE   Final   Gram Stain     Final   Value: MODERATE WBC PRESENT,BOTH PMN AND MONONUCLEAR     NO SQUAMOUS EPITHELIAL CELLS SEEN     NO ORGANISMS SEEN     Performed at [facility]   Culture     Final   Value: FEW STAPHYLOCOCCUS AUREUS     Note: RIFAMPIN AND GENTAMICIN SHOULD NOT BE USED AS SINGLE DRUGS FOR TREATMENT OF STAPH INFECTIONS. This organism DOES NOT demonstrate inducible Clindamycin resistance in vitro.     Performed at [facility]   Report Status 03/15/2013 FINAL    Final   Organism ID, Bacteria STAPHYLOCOCCUS AUREUS   Final  ANAEROBIC CULTURE     Status: None   Collection Time    03/12/13  6:34 PM      Result Value Range Status   Specimen Description ABSCESS FOREARM RIGHT   Final   Special Requests NONE   Final   Gram Stain     Final   Value: MODERATE WBC PRESENT,BOTH PMN AND MONONUCLEAR     NO SQUAMOUS EPITHELIAL CELLS SEEN     RARE GRAM POSITIVE COCCI IN PAIRS     Performed at [facility]   Culture     Final   Value: NO ANAEROBES ISOLATED; CULTURE IN PROGRESS FOR 5 DAYS     Performed at [facility]   Report Status PENDING   Incomplete     Studies: No results found.  Scheduled Meds: . docusate sodium  100 mg Oral BID  . famotidine  20 mg Oral BID  . insulin aspart  0-15 Units Subcutaneous Q4H  . insulin detemir  10 Units Subcutaneous Daily  . multivitamin with minerals  1 tablet Oral Daily  . pantoprazole  40 mg Oral Q1200  . sodium chloride  3 mL Intravenous Q12H  . vancomycin  1,000 mg Intravenous Q8H   Continuous Infusions: . sodium chloride 20 mL/hr at 03/16/13 0235    Active Problems:   Cellulitis and abscess   Sepsis   Diabetes mellitus type 2, uncontrolled   Leukocytosis   Edema of upper extremity   Sinus tachycardia    Time spent: >35 minutes     Esperanza Sheets  [facility] Pager [phone]. If 7PM-7AM, please contact night-coverage at [email or web address], password [facility] 03/16/2013, 9:32 AM  LOS: 6 days

## 2013-03-16 NOTE — Progress Notes (Signed)
Pt stable Dressings dry bil hands mobile and perfused Dc per primary team

## 2013-03-16 NOTE — Progress Notes (Signed)
ANTIBIOTIC CONSULT NOTE - FOLLOW UP  Pharmacy Consult for Vancomycin Indication: MSSA in bilateral forearm abscesses/cellulitis  No Known Allergies  Patient Measurements: Height: 5\' 11"  (180.3 cm) Weight: 232 lb 12.9 oz (105.6 kg) IBW/kg (Calculated) : 75.3  Vital Signs: Temp: 98.3 F (36.8 C) (02/07 0815) Temp src: Oral (02/07 0815) BP: 165/69 mmHg (02/07 0815) Pulse Rate: 106 (02/07 0815) Intake/Output from previous day: 02/06 0701 - 02/07 0700 In: 483 [I.V.:283; IV Piggyback:200] Out: 1900 [Urine:1900]  Labs:  Recent Labs  03/14/13 0338 03/15/13 0821 03/16/13 0445  WBC 26.0* 20.4* 16.3*  HGB 9.4* 9.8* 9.1*  PLT 410* 523* 545*  CREATININE 0.65  --  0.71   Estimated Creatinine Clearance: 166.9 ml/min (by C-G formula based on Cr of 0.71).  Recent Labs  03/15/13 0500  VANCOTROUGH <5.0*    Assessment:    Day # 7 Vancomycin.  Dose increased on 2/6 due to trough < 5 mcg/ml. MSSA in cultures. Changed to Ancef x 1 dose 2/6, then back to Vancomycin.  POD# 4 I&D.    Goal of Therapy:  Vancomycin trough level 15-20 mcg/ml  Plan:   Continue Vancomycin 1 gram IV q8hrs.  Re-check vanc trough level prior to 6 am dose on 2/8.  Dennie FettersEgan, Abigael Mogle Donovan, RPh Pager: [phone] 03/16/2013,11:48 AM

## 2013-03-17 LAB — CULTURE, BLOOD (ROUTINE X 2)
CULTURE: NO GROWTH
CULTURE: NO GROWTH

## 2013-03-17 LAB — ANAEROBIC CULTURE

## 2013-03-17 LAB — GLUCOSE, CAPILLARY
GLUCOSE-CAPILLARY: 147 mg/dL — AB (ref 70–99)
Glucose-Capillary: 137 mg/dL — ABNORMAL HIGH (ref 70–99)
Glucose-Capillary: 143 mg/dL — ABNORMAL HIGH (ref 70–99)

## 2013-03-17 LAB — VANCOMYCIN, TROUGH: Vancomycin Tr: 9.4 ug/mL — ABNORMAL LOW (ref 10.0–20.0)

## 2013-03-17 LAB — CBC
HEMATOCRIT: 27.9 % — AB (ref 39.0–52.0)
Hemoglobin: 9.4 g/dL — ABNORMAL LOW (ref 13.0–17.0)
MCH: 27.8 pg (ref 26.0–34.0)
MCHC: 33.7 g/dL (ref 30.0–36.0)
MCV: 82.5 fL (ref 78.0–100.0)
Platelets: 584 10*3/uL — ABNORMAL HIGH (ref 150–400)
RBC: 3.38 MIL/uL — AB (ref 4.22–5.81)
RDW: 12.9 % (ref 11.5–15.5)
WBC: 14.7 10*3/uL — AB (ref 4.0–10.5)

## 2013-03-17 LAB — CULTURE, BLOOD (SINGLE): Culture: NO GROWTH

## 2013-03-17 MED ORDER — INSULIN DETEMIR 100 UNIT/ML FLEXPEN: 10.0000 [IU] | PEN_INJECTOR | Freq: Every day | SUBCUTANEOUS | Status: AC

## 2013-03-17 MED ORDER — CLINDAMYCIN HCL 300 MG PO CAPS: 300.0000 mg | ORAL_CAPSULE | Freq: Three times a day (TID) | ORAL | Status: AC

## 2013-03-17 MED ORDER — AMLODIPINE BESYLATE 5 MG PO TABS: 5.0000 mg | ORAL_TABLET | Freq: Every day | ORAL | Status: AC

## 2013-03-17 MED ORDER — VANCOMYCIN HCL IN DEXTROSE 1-5 GM/200ML-% IV SOLN
1000.0000 mg | Freq: Four times a day (QID) | INTRAVENOUS | Status: DC
  Filled 2013-03-17 (×3): qty 200

## 2013-03-17 MED ORDER — TRAMADOL HCL 50 MG PO TABS: 50.0000 mg | ORAL_TABLET | Freq: Four times a day (QID) | ORAL | Status: AC | PRN

## 2013-03-17 NOTE — Progress Notes (Signed)
ANTIBIOTIC CONSULT NOTE - FOLLOW UP  Pharmacy Consult for Vancomycin  Indication: Cellulitis  No Known Allergies  Patient Measurements: Height: 5\' 11"  (180.3 cm) Weight: 232 lb 12.9 oz (105.6 kg) IBW/kg (Calculated) : 75.3  Vital Signs: Temp: 98.8 F (37.1 C) (02/08 0522) Temp src: Oral (02/08 0522) BP: 136/63 mmHg (02/08 0522) Pulse Rate: 85 (02/08 0522) Intake/Output from previous day: 02/07 0701 - 02/08 0700 In: 940 [P.O.:480; I.V.:260; IV Piggyback:200] Out: 900 [Urine:900] Intake/Output from this shift: Total I/O In: -  Out: 400 [Urine:400]  Labs:  Recent Labs  03/15/13 0821 03/16/13 0445 03/17/13 0450  WBC 20.4* 16.3* 14.7*  HGB 9.8* 9.1* 9.4*  PLT 523* 545* 584*  CREATININE  --  0.71  --    Estimated Creatinine Clearance: 166.9 ml/min (by C-G formula based on Cr of 0.71).  Recent Labs  03/15/13 0500 03/17/13 0450  VANCOTROUGH <5.0* 9.4*    Microbiology: Recent Results (from the past 720 hour(s))  CULTURE, BLOOD (SINGLE)     Status: None   Collection Time    03/10/13  6:10 PM      Result Value Range Status   Specimen Description BLOOD RIGHT ARM   Final   Special Requests BOTTLES DRAWN AEROBIC AND ANAEROBIC 5CC EACH   Final   Culture  Setup Time     Final   Value: 03/11/2013 02:15     Performed at [facility]   Culture     Final   Value:        BLOOD CULTURE RECEIVED NO GROWTH TO DATE CULTURE WILL BE HELD FOR 5 DAYS BEFORE ISSUING A FINAL NEGATIVE REPORT     Performed at [facility]   Report Status PENDING   Incomplete  MRSA PCR SCREENING     Status: None   Collection Time    03/10/13 11:07 PM      Result Value Range Status   MRSA by PCR NEGATIVE  NEGATIVE Final   Comment:            The GeneXpert MRSA Assay (FDA     approved for NASAL specimens     only), is one component of a     comprehensive MRSA colonization     surveillance program. It is not     intended to diagnose MRSA     infection nor to guide or     monitor  treatment for     MRSA infections.  CULTURE, BLOOD (ROUTINE X 2)     Status: None   Collection Time    03/11/13  3:10 PM      Result Value Range Status   Specimen Description BLOOD RIGHT HAND   Final   Special Requests BOTTLES DRAWN AEROBIC ONLY 3CC   Final   Culture  Setup Time     Final   Value: 03/11/2013 21:41     Performed at [facility]   Culture     Final   Value:        BLOOD CULTURE RECEIVED NO GROWTH TO DATE CULTURE WILL BE HELD FOR 5 DAYS BEFORE ISSUING A FINAL NEGATIVE REPORT     Performed at [facility]   Report Status PENDING   Incomplete  CULTURE, BLOOD (ROUTINE X 2)     Status: None   Collection Time    03/11/13  4:05 PM      Result Value Range Status   Specimen Description BLOOD RIGHT HAND   Final  Special Requests BOTTLES DRAWN AEROBIC ONLY 4CC   Final   Culture  Setup Time     Final   Value: 03/11/2013 21:40     Performed at [facility]   Culture     Final   Value:        BLOOD CULTURE RECEIVED NO GROWTH TO DATE CULTURE WILL BE HELD FOR 5 DAYS BEFORE ISSUING A FINAL NEGATIVE REPORT     Performed at [facility]   Report Status PENDING   Incomplete  GRAM STAIN     Status: None   Collection Time    03/12/13  5:20 PM      Result Value Range Status   Specimen Description ABSCESS LEFT ARM   Final   Special Requests DIFFUSE ARM ABSCESS   Final   Gram Stain     Final   Value: ABUNDANT WBC PRESENT,BOTH PMN AND MONONUCLEAR     FEW GRAM POSITIVE COCCI IN PAIRS IN CLUSTERS     Gram Stain Report Called to,Read Back By and Verified With: Gates Rigg 1914 03/12/13 A BROWNING   Report Status 03/12/2013 FINAL   Final  ANAEROBIC CULTURE     Status: None   Collection Time    03/12/13  5:20 PM      Result Value Range Status   Specimen Description ABSCESS LEFT ARM   Final   Special Requests NONE  DIFFUSE LEFT ARM ABSCESS   Final   Gram Stain     Final   Value: ABUNDANT WBC PRESENT,BOTH PMN AND MONONUCLEAR     RARE SQUAMOUS EPITHELIAL  CELLS PRESENT     FEW GRAM POSITIVE COCCI     IN PAIRS IN CLUSTERS Performed at [facility]     Performed at [facility]   Culture     Final   Value: NO ANAEROBES ISOLATED     Note: Gram Stain Report Called to,Read Back By and Verified With: Gates Rigg 1914 03/12/13 A BROWNING     Performed at [facility]   Report Status PENDING   Incomplete  CULTURE, ROUTINE-ABSCESS     Status: None   Collection Time    03/12/13  5:20 PM      Result Value Range Status   Specimen Description ABSCESS LEFT ARM   Final   Special Requests DIFFUSE ARM ABSCESS   Final   Gram Stain     Final   Value: ABUNDANT WBC PRESENT,BOTH PMN AND MONONUCLEAR     RARE SQUAMOUS EPITHELIAL CELLS PRESENT     FEW GRAM POSITIVE COCCI     IN PAIRS IN CLUSTERS Performed at [facility] Gram Stain Report Called to,Read Back By and Verified With: Gram Stain Report Called to,Read Back By and Verified With: Gates Rigg 1914 03/12/13 A BROWNING     Performed at [facility]   Culture     Final   Value: MODERATE STAPHYLOCOCCUS AUREUS     Note: RIFAMPIN AND GENTAMICIN SHOULD NOT BE USED AS SINGLE DRUGS FOR TREATMENT OF STAPH INFECTIONS. This organism DOES NOT demonstrate inducible Clindamycin resistance in vitro.     Performed at [facility]   Report Status 03/15/2013 FINAL   Final   Organism ID, Bacteria STAPHYLOCOCCUS AUREUS   Final  CULTURE, ROUTINE-ABSCESS     Status: None   Collection Time    03/12/13  6:34 PM      Result Value Range Status   Specimen Description ABSCESS FOREARM RIGHT  Final   Special Requests NONE   Final   Gram Stain     Final   Value: MODERATE WBC PRESENT,BOTH PMN AND MONONUCLEAR     NO SQUAMOUS EPITHELIAL CELLS SEEN     NO ORGANISMS SEEN     Performed at [facility]   Culture     Final   Value: FEW STAPHYLOCOCCUS AUREUS     Note: RIFAMPIN AND GENTAMICIN SHOULD NOT BE USED AS SINGLE DRUGS FOR TREATMENT OF STAPH INFECTIONS. This organism DOES  NOT demonstrate inducible Clindamycin resistance in vitro.     Performed at [facility]   Report Status 03/15/2013 FINAL   Final   Organism ID, Bacteria STAPHYLOCOCCUS AUREUS   Final  ANAEROBIC CULTURE     Status: None   Collection Time    03/12/13  6:34 PM      Result Value Range Status   Specimen Description ABSCESS FOREARM RIGHT   Final   Special Requests NONE   Final   Gram Stain     Final   Value: MODERATE WBC PRESENT,BOTH PMN AND MONONUCLEAR     NO SQUAMOUS EPITHELIAL CELLS SEEN     RARE GRAM POSITIVE COCCI IN PAIRS     Performed at [facility]   Culture     Final   Value: NO ANAEROBES ISOLATED; CULTURE IN PROGRESS FOR 5 DAYS     Performed at [facility]   Report Status PENDING   Incomplete    Anti-infectives   Start     Dose/Rate Route Frequency Ordered Stop   03/17/13 1100  vancomycin (VANCOCIN) IVPB 1000 mg/200 mL premix     1,000 mg 200 mL/hr over 60 Minutes Intravenous Every 6 hours 03/17/13 0546     03/15/13 2000  vancomycin (VANCOCIN) IVPB 1000 mg/200 mL premix  Status:  Discontinued     1,000 mg 200 mL/hr over 60 Minutes Intravenous Every 8 hours 03/15/13 1919 03/17/13 0546   03/15/13 1415  ceFAZolin (ANCEF) IVPB 1 g/50 mL premix  Status:  Discontinued     1 g 100 mL/hr over 30 Minutes Intravenous 3 times per day 03/15/13 1412 03/15/13 1840   03/15/13 1000  vancomycin (VANCOCIN) 1,250 mg in sodium chloride 0.9 % 250 mL IVPB  Status:  Discontinued     1,250 mg 166.7 mL/hr over 90 Minutes Intravenous Every 8 hours 03/15/13 0602 03/15/13 1412   03/11/13 2200  doxycycline (VIBRA-TABS) tablet 100 mg  Status:  Discontinued     100 mg Oral Every 12 hours 03/11/13 1918 03/12/13 0852   03/11/13 1930  levofloxacin (LEVAQUIN) tablet 750 mg  Status:  Discontinued     750 mg Oral Daily 03/11/13 1859 03/13/13 2044   03/11/13 0600  vancomycin (VANCOCIN) IVPB 1000 mg/200 mL premix  Status:  Discontinued     1,000 mg 200 mL/hr over 60 Minutes  Intravenous Every 12 hours 03/10/13 2348 03/15/13 0602   03/10/13 2359  piperacillin-tazobactam (ZOSYN) IVPB 3.375 g  Status:  Discontinued     3.375 g 12.5 mL/hr over 240 Minutes Intravenous 3 times per day 03/10/13 2343 03/14/13 0909   03/10/13 2000  piperacillin-tazobactam (ZOSYN) IVPB 4.5 g  Status:  Discontinued     4.5 g 133.3 mL/hr over 30 Minutes Intravenous  Once 03/10/13 1945 03/15/13 1412   03/10/13 1818  vancomycin (VANCOCIN) 500 MG powder    Comments:  Karl Luke   : cabinet override      03/10/13 1818 03/10/13 1834  03/10/13 1815  vancomycin (VANCOCIN) 1,500 mg in sodium chloride 0.9 % 500 mL IVPB     1,500 mg 250 mL/hr over 120 Minutes Intravenous  Once 03/10/13 1810 03/10/13 2035      Assessment: 33 y/o M on vancomycin for abscesses, r/o bacteremia. Vancomycin trough on 1000 mg IV q8h is 9.4, other labs as above.   Goal of Therapy:  Vancomycin trough level 15-20 mcg/ml  Plan:  -Increase vancomycin to 1000 mg IV q6h -Re-check vancomycin trough at steady state -F/U blood culture results  Jay Snyder, Jay Snyder 03/17/2013,5:51 AM

## 2013-03-17 NOTE — Discharge Summary (Signed)
Physician Discharge Summary  Jay Snyder ZOX:[id number] DOB: [birth year] DOA: 03/10/2013  PCP: No primary provider on file.  Admit date: 03/10/2013 Discharge date: 03/17/2013  Time spent: >35 minutes  Recommendations for Outpatient Follow-up:  F/u with orthopedics next week  F/u with PCP in 10 days  Discharge Diagnoses:  Active Problems:   Cellulitis and abscess   Sepsis   Diabetes mellitus type 2, uncontrolled   Leukocytosis   Edema of upper extremity   Sinus tachycardia   Discharge Condition: steble   Diet recommendation:DM  Filed Weights   03/10/13 1723 03/10/13 2307  Weight: 101.606 kg (224 lb) 105.6 kg (232 lb 12.9 oz)    History of present illness:  33 year old male patient known diabetic on metformin at home. Presented with a 3-4 day history of severe left arm swelling. Patient developed the symptoms after injecting cocaine into his arm. Also endorsed fever up to 102.2.   Hospital Course:  1. Sepsis  -Sepsis physiology resolved  -Source upper extremity cellulitis with abscesses  -Intraoperative wound cultures positive for staph aureus-cont IV anbx's while inpatient then transition to PO- keep in mind pt is self pay and has to pay for meds OOP  -WBC with slow downward trend  -Blood cultures no growth to date  2. Cellulitis and multifocal abscesses / Edema of upper extremity  -MRI left upper extremity revealed abscesses extending from the antecubital region primarily involving the pronator teres muscle as well as myositis of the pronator teres, supinator, brachial radialis, proximal extensor musculature and to a lesser extent flexor carpi radialis as well as confirmed extensive cellulitis and forearm.  -now s/p I/D with muscle and soft tissue/skin debridement 2/3-per orthopedic surgeon no further surgical intervention required  -atx changed to linda per sensitivity; orthopedics follow up next week   3.IVDU pt counseled by attending MD re need complete cessation of illicit  drugs  4. Diabetes mellitus type 2, uncontrolled  -Began low-dose Levemir Hemoglobin A1c 9.8, outpatient titration as needed  -Current CBG is well controlled  5. HTN  -patient has been told in the past that he had HTN but he was not on treatment  -Suspect pain contributing  -up at times and may need to begin meds before dc- has not followed up with PCP in "some time"  -started amlodipine      Procedures:  I&D (i.e. Studies not automatically included, echos, thoracentesis, etc; not x-rays)  Consultations:  ortho  Discharge Exam: Filed Vitals:   03/17/13 0522  BP: 136/63  Pulse: 85  Temp: 98.8 F (37.1 C)  Resp: 18    General: alert Cardiovascular: s1,s2 rrr Respiratory: CTA BL  Discharge Instructions  Discharge Orders   Future Orders Complete By Expires   Diet - low sodium heart healthy  As directed    Discharge instructions  As directed    Comments:     Please follow up with orthopedics next week   Increase activity slowly  As directed        Medication List         amLODipine 5 MG tablet  Commonly known as:  NORVASC  Take 1 tablet (5 mg total) by mouth daily.     clindamycin 300 MG capsule  Commonly known as:  CLEOCIN  Take 1 capsule (300 mg total) by mouth 3 (three) times daily.     Insulin Detemir 100 UNIT/ML Pen  Commonly known as:  LEVEMIR  Inject 10 Units into the skin daily at 10 pm.  metFORMIN 1000 MG tablet  Commonly known as:  GLUCOPHAGE  Take 1,000 mg by mouth 2 (two) times daily with a meal.     multivitamin with minerals Tabs tablet  Take 1 tablet by mouth daily.     traMADol 50 MG tablet  Commonly known as:  ULTRAM  Take 1 tablet (50 mg total) by mouth every 6 (six) hours as needed.       No Known Allergies     Follow-up Information   Follow up with Cheral Almas, MD. Schedule an appointment as soon as possible for a visit in 1 week.   Specialty:  Orthopedic Surgery   Contact information:   [address] [place] [address] [phone]       Follow up with Cammy Copa, MD. Schedule an appointment as soon as possible for a visit in 1 week.   Specialty:  Orthopedic Surgery   Contact information:   [address] [place] [address] [phone]        The results of significant diagnostics from this hospitalization (including imaging, microbiology, ancillary and laboratory) are listed below for reference.    Significant Diagnostic Studies: Dg Forearm Left  03/10/2013   CLINICAL DATA:  Abscess posterior forearm.  EXAM: LEFT FOREARM - 2 VIEW  COMPARISON:  None.  FINDINGS: Imaged bones, joints and soft tissues appear normal.  IMPRESSION: Negative exam.   Electronically Signed   By: Drusilla Kanner M.D.   On: 03/10/2013 20:34   Mr Humerus Left W Wo Contrast  03/12/2013   CLINICAL DATA:  IV drug use. Abscesses in the upper arm. Systemic inflammatory response syndrome. Diabetes.  EXAM: MRI OF THE LEFT HUMERUS WITHOUT AND WITH CONTRAST  TECHNIQUE: Multiplanar, multisequence MR imaging was performed both before and after administration of intravenous contrast.  CONTRAST:  20mL MULTIHANCE GADOBENATE DIMEGLUMINE 529 MG/ML IV SOLN  COMPARISON:  DG HUMERUS*L* dated 03/10/2013  FINDINGS: From approximately the mid humeral level to the level of the elbow joint, multiloculated abscess is observed involving the biceps, brachialis, and to a lesser extent brachia radialis muscles, with is conglomerate size of the abscesses approximately 16.4 cm proximal to distal, 6.9 cm transverse, and 3.8 cm anterior-posterior.  As expected there is extensive myositis involving the biceps, brachialis, and brachia radialis but also tracking in the lateral head of the triceps and medial head of the triceps, and extending around the neurovascular structures. Extensive cellulitis in the upper arm extending all the way to the shoulder region. Reactive axillary adenopathy noted with lymph nodes measuring up to 1.7  cm in short axis.  No humeral osteomyelitis observed.  IMPRESSION: 1. Multi loculated but confluent abscesses involving the biceps, brachialis, and brachioradialis muscles, extending from the mid humeral level distally towards the antecubital fossa. Extensive myositis involving these muscles and tracking also into the medial and lateral heads of the triceps. Prominent cellulitis in the upper arm extending into the shoulder region. Reactive axillary adenopathy.   Electronically Signed   By: Herbie Baltimore M.D.   On: 03/12/2013 08:28   Mr Forearm Left Wo/w Cm  03/12/2013   CLINICAL DATA:  IV drug abuse.  Abscess is in the arm.  EXAM: MRI OF THE LEFT FOREARM WITHOUT AND WITH CONTRAST  TECHNIQUE: Multiplanar, multisequence MR imaging was performed both before and after administration of intravenous contrast.  CONTRAST:  20mL MULTIHANCE GADOBENATE DIMEGLUMINE 529 MG/ML IV SOLN  COMPARISON:  DG FOREARM*L* dated 03/10/2013  FINDINGS: Indistinct antecubital abscess is noted  with abscess is also extending in the proximal forearm within the pronator teres, and measuring approximately 9.4 cm proximal to distal by 3.6 cm transverse by 3.2 cm anterior-posterior. Myositis involves the pronator teres, supinator, brachia radialis, and proximal extensor musculature, and to a lesser extent the flexor carpi radialis. Extensive cellulitis in the forearm tracks distally towards the wrist. No obvious osteomyelitis.  Despite efforts by the technologist and patient, motion artifact is present on today's exam and could not be eliminated. This reduces exam sensitivity and specificity.  IMPRESSION: 1. Abscesses extending from the antecubital region primarily involves the pronator teres muscle. 2. Myositis of the pronator teres, supinator, brachioradialis, proximal extensor musculature, and to a lesser extent flexor carpi radialis. 3. Extensive cellulitis in the forearm.   Electronically Signed   By: Herbie Baltimore M.D.   On: 03/12/2013  08:34   Dg Chest Port 1 View  03/11/2013   CLINICAL DATA:  Line placement.  EXAM: PORTABLE CHEST - 1 VIEW  COMPARISON:  None.  FINDINGS: A right IJ catheter is in place with the tip projecting over the mid superior vena cava. No pneumothorax. The lungs are clear. Heart size is normal. No focal bony abnormality.  IMPRESSION: Right IJ catheter tip projects over the mid superior vena cava. No pneumothorax or acute disease.   Electronically Signed   By: Drusilla Kanner M.D.   On: 03/11/2013 22:29   Dg Humerus Left  03/10/2013   CLINICAL DATA:  Abscess posterior forearm.  EXAM: LEFT HUMERUS - 2+ VIEW  COMPARISON:  None.  FINDINGS: Imaged bones, joints and soft tissues appear normal.  IMPRESSION: Negative exam.   Electronically Signed   By: Drusilla Kanner M.D.   On: 03/10/2013 20:33    Microbiology: Recent Results (from the past 240 hour(s))  CULTURE, BLOOD (SINGLE)     Status: None   Collection Time    03/10/13  6:10 PM      Result Value Range Status   Specimen Description BLOOD RIGHT ARM   Final   Special Requests BOTTLES DRAWN AEROBIC AND ANAEROBIC 5CC EACH   Final   Culture  Setup Time     Final   Value: 03/11/2013 02:15     Performed at [facility]   Culture     Final   Value:        BLOOD CULTURE RECEIVED NO GROWTH TO DATE CULTURE WILL BE HELD FOR 5 DAYS BEFORE ISSUING A FINAL NEGATIVE REPORT     Performed at [facility]   Report Status PENDING   Incomplete  MRSA PCR SCREENING     Status: None   Collection Time    03/10/13 11:07 PM      Result Value Range Status   MRSA by PCR NEGATIVE  NEGATIVE Final   Comment:            The GeneXpert MRSA Assay (FDA     approved for NASAL specimens     only), is one component of a     comprehensive MRSA colonization     surveillance program. It is not     intended to diagnose MRSA     infection nor to guide or     monitor treatment for     MRSA infections.  CULTURE, BLOOD (ROUTINE X 2)     Status: None   Collection Time     03/11/13  3:10 PM      Result Value Range Status   Specimen Description BLOOD  RIGHT HAND   Final   Special Requests BOTTLES DRAWN AEROBIC ONLY 3CC   Final   Culture  Setup Time     Final   Value: 03/11/2013 21:41     Performed at [facility]   Culture     Final   Value:        BLOOD CULTURE RECEIVED NO GROWTH TO DATE CULTURE WILL BE HELD FOR 5 DAYS BEFORE ISSUING A FINAL NEGATIVE REPORT     Performed at [facility]   Report Status PENDING   Incomplete  CULTURE, BLOOD (ROUTINE X 2)     Status: None   Collection Time    03/11/13  4:05 PM      Result Value Range Status   Specimen Description BLOOD RIGHT HAND   Final   Special Requests BOTTLES DRAWN AEROBIC ONLY 4CC   Final   Culture  Setup Time     Final   Value: 03/11/2013 21:40     Performed at [facility]   Culture     Final   Value:        BLOOD CULTURE RECEIVED NO GROWTH TO DATE CULTURE WILL BE HELD FOR 5 DAYS BEFORE ISSUING A FINAL NEGATIVE REPORT     Performed at [facility]   Report Status PENDING   Incomplete  GRAM STAIN     Status: None   Collection Time    03/12/13  5:20 PM      Result Value Range Status   Specimen Description ABSCESS LEFT ARM   Final   Special Requests DIFFUSE ARM ABSCESS   Final   Gram Stain     Final   Value: ABUNDANT WBC PRESENT,BOTH PMN AND MONONUCLEAR     FEW GRAM POSITIVE COCCI IN PAIRS IN CLUSTERS     Gram Stain Report Called to,Read Back By and Verified With: Gates RiggM EVANGELISTA 1914 03/12/13 A BROWNING   Report Status 03/12/2013 FINAL   Final  ANAEROBIC CULTURE     Status: None   Collection Time    03/12/13  5:20 PM      Result Value Range Status   Specimen Description ABSCESS LEFT ARM   Final   Special Requests NONE  DIFFUSE LEFT ARM ABSCESS   Final   Gram Stain     Final   Value: ABUNDANT WBC PRESENT,BOTH PMN AND MONONUCLEAR     RARE SQUAMOUS EPITHELIAL CELLS PRESENT     FEW GRAM POSITIVE COCCI     IN PAIRS IN CLUSTERS [facility]      [facility]   Culture     Final   Value: NO ANAEROBES ISOLATED     Note: Gram Stain Report Called to,Read Back By and Verified With: Gates RiggM EVANGELISTA 1914 03/12/13 A BROWNING     Performed at [facility]   Report Status PENDING   Incomplete  CULTURE, ROUTINE-ABSCESS     Status: None   Collection Time    03/12/13  5:20 PM      Result Value Range Status   Specimen Description ABSCESS LEFT ARM   Final   Special Requests DIFFUSE ARM ABSCESS   Final   Gram Stain     Final   Value: ABUNDANT WBC PRESENT,BOTH PMN AND MONONUCLEAR     RARE SQUAMOUS EPITHELIAL CELLS PRESENT     FEW GRAM POSITIVE COCCI     IN PAIRS IN CLUSTERS Performed at [facility] Gram Stain  Report Called to,Read Back By and Verified With: Gram Stain Report Called to,Read Back By and Verified With: Gates Rigg 1914 03/12/13 A BROWNING     Performed at [facility]   Culture     Final   Value: MODERATE STAPHYLOCOCCUS AUREUS     Note: RIFAMPIN AND GENTAMICIN SHOULD NOT BE USED AS SINGLE DRUGS FOR TREATMENT OF STAPH INFECTIONS. This organism DOES NOT demonstrate inducible Clindamycin resistance in vitro.     Performed at [facility]   Report Status 03/15/2013 FINAL   Final   Organism ID, Bacteria STAPHYLOCOCCUS AUREUS   Final  CULTURE, ROUTINE-ABSCESS     Status: None   Collection Time    03/12/13  6:34 PM      Result Value Range Status   Specimen Description ABSCESS FOREARM RIGHT   Final   Special Requests NONE   Final   Gram Stain     Final   Value: MODERATE WBC PRESENT,BOTH PMN AND MONONUCLEAR     NO SQUAMOUS EPITHELIAL CELLS SEEN     NO ORGANISMS SEEN     Performed at [facility]   Culture     Final   Value: FEW STAPHYLOCOCCUS AUREUS     Note: RIFAMPIN AND GENTAMICIN SHOULD NOT BE USED AS SINGLE DRUGS FOR TREATMENT OF STAPH INFECTIONS. This organism DOES NOT demonstrate inducible Clindamycin resistance in vitro.     Performed at [facility]   Report  Status 03/15/2013 FINAL   Final   Organism ID, Bacteria STAPHYLOCOCCUS AUREUS   Final  ANAEROBIC CULTURE     Status: None   Collection Time    03/12/13  6:34 PM      Result Value Range Status   Specimen Description ABSCESS FOREARM RIGHT   Final   Special Requests NONE   Final   Gram Stain     Final   Value: MODERATE WBC PRESENT,BOTH PMN AND MONONUCLEAR     NO SQUAMOUS EPITHELIAL CELLS SEEN     RARE GRAM POSITIVE COCCI IN PAIRS     Performed at [facility]   Culture     Final   Value: NO ANAEROBES ISOLATED; CULTURE IN PROGRESS FOR 5 DAYS     Performed at [facility]   Report Status PENDING   Incomplete     Labs: Basic Metabolic Panel:  Recent Labs Lab 03/10/13 1810 03/11/13 0130 03/12/13 0440 03/13/13 0856 03/14/13 0338 03/16/13 0445  NA 134* 132* 138 137 141 138  K 4.0 3.5* 3.6* 3.8 3.9 4.0  CL 95* 95* 99 99 104 102  CO2 22 22 24 26 20 24   GLUCOSE 491* 192* 148* 173* 127* 137*  BUN 17 11 11 9 10 11   CREATININE 0.80 0.64 0.63 0.68 0.65 0.71  CALCIUM 9.7 8.8 8.1* 7.8* 8.0* 8.3*  MG  --  1.7  --   --   --   --   PHOS  --  2.3  --   --   --   --    Liver Function Tests:  Recent Labs Lab 03/11/13 0130 03/12/13 0440  AST 34 33  ALT 31 32  ALKPHOS 181* 259*  BILITOT 0.2* 0.2*  PROT 6.5 6.4  ALBUMIN 2.1* 2.1*   No results found for this basename: LIPASE, AMYLASE,  in the last 168 hours No results found for this basename: AMMONIA,  in the last 168 hours CBC:  Recent Labs Lab 03/10/13 1810  03/13/13 0856 03/14/13 1478  03/15/13 0821 03/16/13 0445 03/17/13 0450  WBC 33.0*  < > 27.4* 26.0* 20.4* 16.3* 14.7*  NEUTROABS 29.0*  --   --   --   --   --   --   HGB 13.9  < > 10.4* 9.4* 9.8* 9.1* 9.4*  HCT 41.1  < > 30.7* 27.5* 27.8* 26.6* 27.9*  MCV 83.5  < > 82.7 83.1 82.7 82.1 82.5  PLT 302  < > 392 410* 523* 545* 584*  < > = values in this interval not displayed. Cardiac Enzymes: No results found for this basename: CKTOTAL, CKMB, CKMBINDEX,  TROPONINI,  in the last 168 hours BNP: BNP (last 3 results) No results found for this basename: PROBNP,  in the last 8760 hours CBG:  Recent Labs Lab 03/16/13 1626 03/16/13 2002 03/17/13 0007 03/17/13 0400 03/17/13 0742  GLUCAP 182* 168* 147* 137* 143*       Signed:  Shandelle Borrelli N  [facility] 03/17/2013, 8:42 AM

## 2013-03-17 NOTE — Progress Notes (Signed)
Patient provided with discharge instructions and follow up information. Dressings clean dry and intact and will be changed at his follow-up appointment with Ortho in 1 week. Educated on the importance of taking his antibiotic until completion. He is going home at this time with fiance, who is at bedside.

## 2013-03-18 ENCOUNTER — Encounter (HOSPITAL_COMMUNITY): Payer: Self-pay | Admitting: Orthopaedic Surgery

## 2015-07-11 IMAGING — CR DG HUMERUS 2V *L*
2 series · 2 of 2 positions shown · non-contrast
Comparison: None.

CLINICAL DATA: Abscess posterior forearm.

EXAM:
LEFT HUMERUS - 2+ VIEW

[w humerus ap left *]
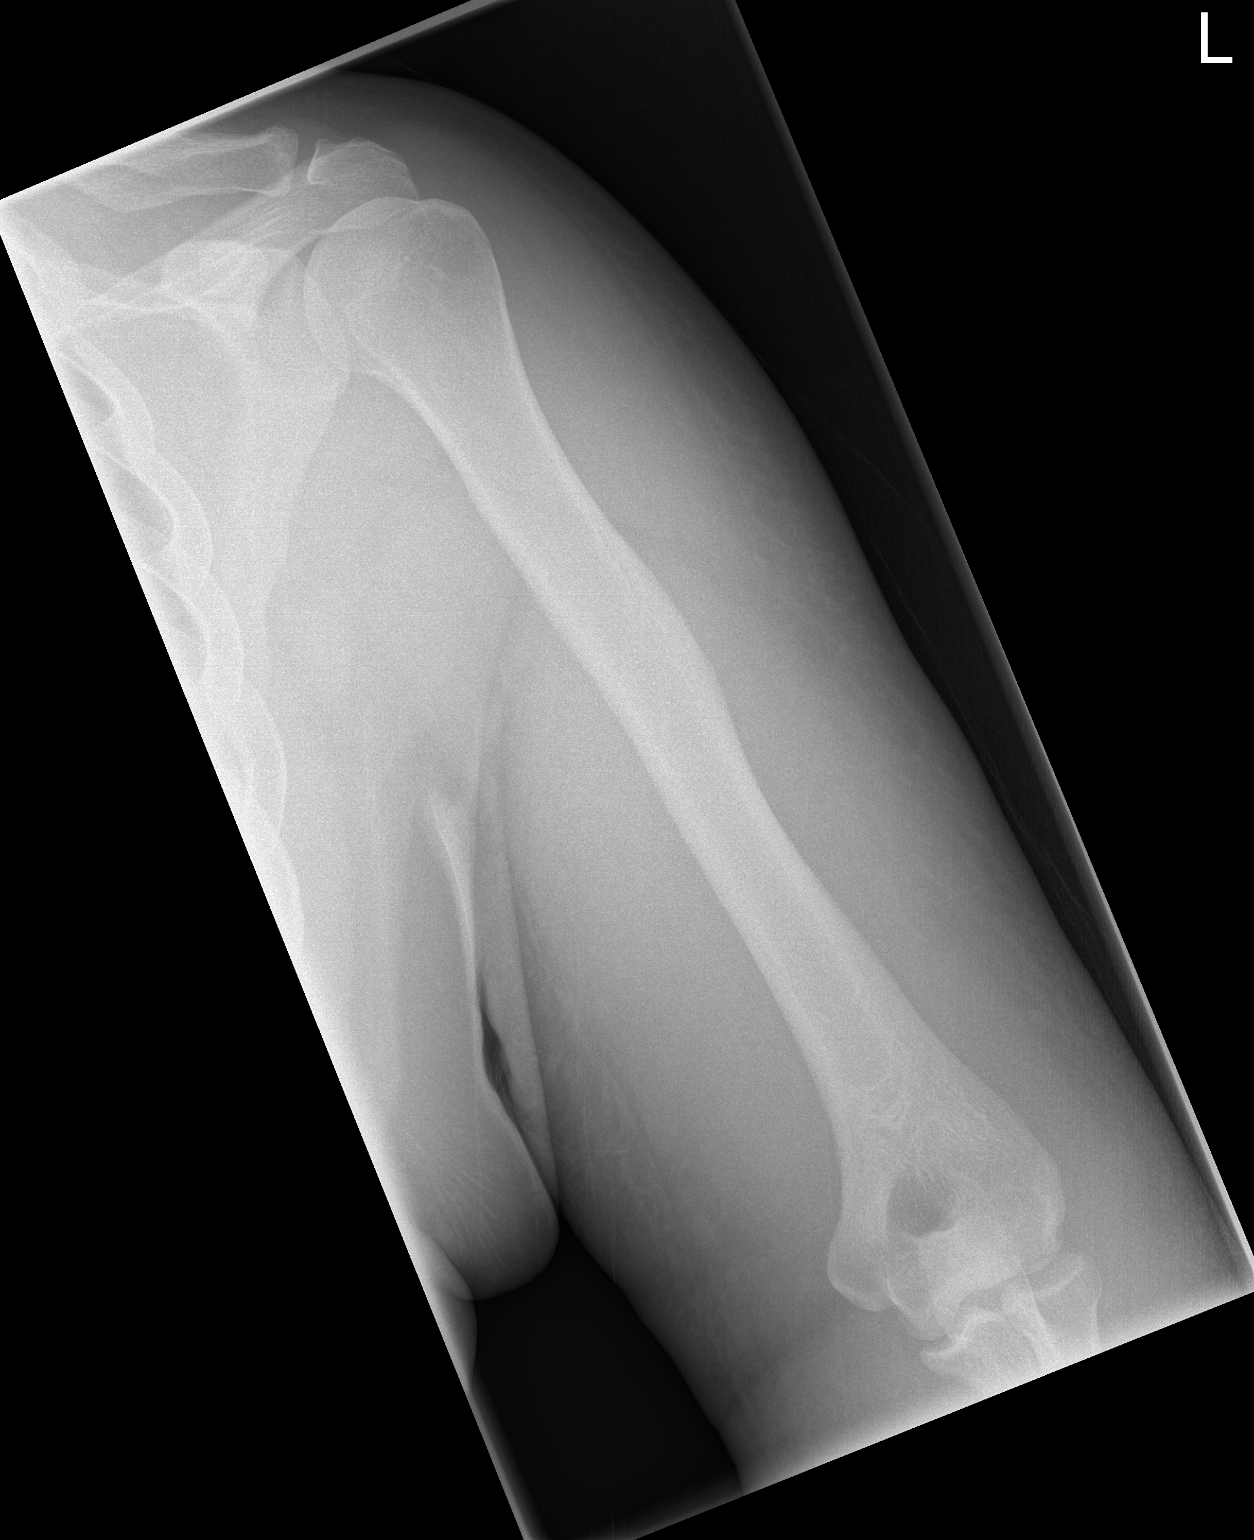

[w humerus lat left *]
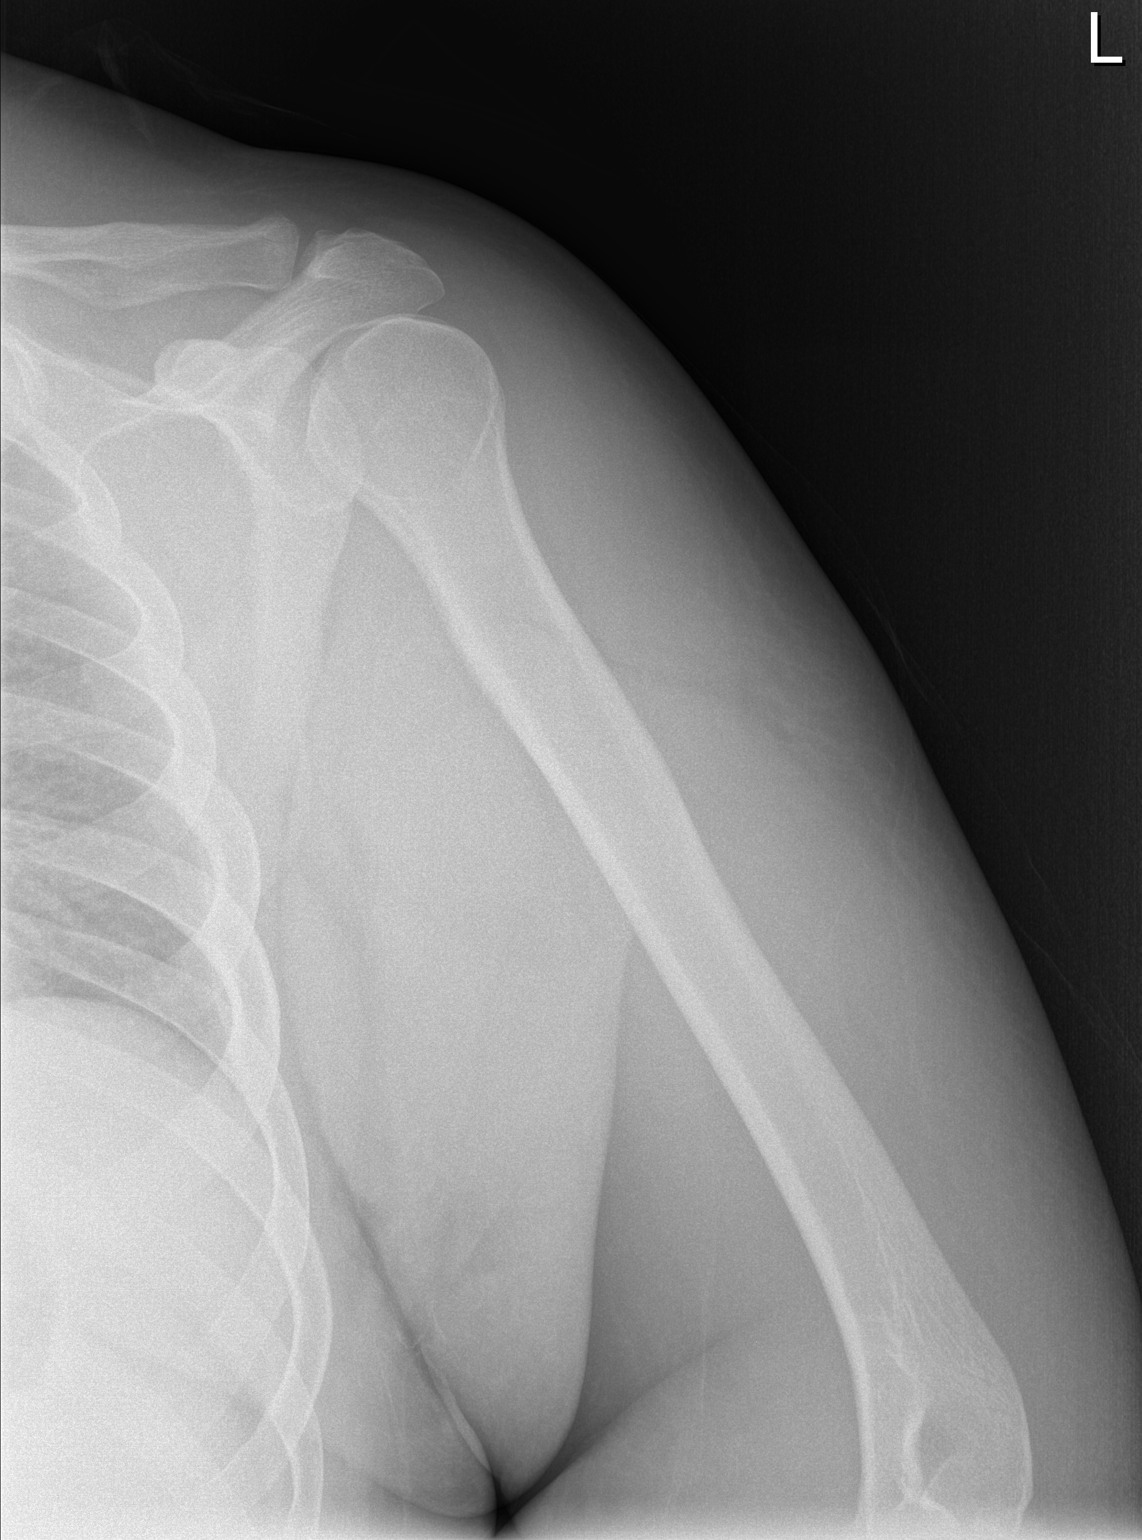

[2 of 2 positions shown; findings below may reference images not displayed]

FINDINGS: Imaged bones, joints and soft tissues appear normal.
IMPRESSION: Negative exam.

## 2019-06-25 ENCOUNTER — Other Ambulatory Visit: Payer: Self-pay | Admitting: Nurse Practitioner

## 2019-06-25 DIAGNOSIS — B1920 Unspecified viral hepatitis C without hepatic coma: Secondary | ICD-10-CM

## 2019-07-19 ENCOUNTER — Ambulatory Visit
Admission: RE | Admit: 2019-07-19 | Discharge: 2019-07-19 | Disposition: A | Discharge status: Home / Self Care | Payer: 59 | Source: Ambulatory Visit | Attending: Nurse Practitioner | Admitting: Nurse Practitioner

## 2019-07-19 DIAGNOSIS — B1920 Unspecified viral hepatitis C without hepatic coma: Secondary | ICD-10-CM

## 2019-10-04 ENCOUNTER — Other Ambulatory Visit: Payer: Self-pay

## 2019-10-04 ENCOUNTER — Ambulatory Visit (INDEPENDENT_AMBULATORY_CARE_PROVIDER_SITE_OTHER): Payer: 59 | Admitting: Sports Medicine

## 2019-10-04 ENCOUNTER — Ambulatory Visit (INDEPENDENT_AMBULATORY_CARE_PROVIDER_SITE_OTHER): Payer: 59

## 2019-10-04 ENCOUNTER — Encounter: Payer: Self-pay | Admitting: Sports Medicine

## 2019-10-04 DIAGNOSIS — E11649 Type 2 diabetes mellitus with hypoglycemia without coma: Secondary | ICD-10-CM | POA: Diagnosis not present

## 2019-10-04 DIAGNOSIS — L84 Corns and callosities: Secondary | ICD-10-CM

## 2019-10-04 DIAGNOSIS — T148XXA Other injury of unspecified body region, initial encounter: Secondary | ICD-10-CM

## 2019-10-04 DIAGNOSIS — M79671 Pain in right foot: Secondary | ICD-10-CM

## 2019-10-04 DIAGNOSIS — M205X1 Other deformities of toe(s) (acquired), right foot: Secondary | ICD-10-CM

## 2019-10-04 NOTE — Progress Notes (Signed)
Subjective: Jay Snyder is a 39 y.o. male patient seen in office for evaluation of a dark area to the right hallux has been there for a couple of weeks reports that he always gets a callus there for several years but recently it started to turn black does not recall any type of injury or trauma denies any drainage open wound swelling warmth but does admit to a little bit of redness to the right great toe.  Patient does finally admit that this could have started from wearing a new pair shoes but is unsure.  Patient reports that his blood sugar this morning was 130.  A1c 11.3.  Last visit to PCP Dr. Welton Flakes 1 month ago.  No other pedal complaints noted.  Review of systems noncontributory.  Patient Active Problem List   Diagnosis Date Noted  . Sepsis (HCC) 03/11/2013  . Diabetes mellitus type 2, uncontrolled (HCC) 03/11/2013  . Leukocytosis 03/11/2013  . Edema of upper extremity 03/11/2013  . Sinus tachycardia 03/11/2013  . Cellulitis and abscess 03/10/2013  . Hepatitis C infection 09/28/2011   Current Outpatient Medications on File Prior to Visit  Medication Sig Dispense Refill  . amLODipine (NORVASC) 5 MG tablet Take 1 tablet (5 mg total) by mouth daily. 30 tablet 2  . Buprenorphine HCl-Naloxone HCl 8-2 MG FILM Place under the tongue.    . ciprofloxacin (CIPRO) 500 MG tablet Take 500 mg by mouth 2 (two) times daily.    . clindamycin (CLEOCIN) 300 MG capsule Take 1 capsule (300 mg total) by mouth 3 (three) times daily. 30 capsule 0  . Insulin Detemir (LEVEMIR) 100 UNIT/ML Pen Inject 10 Units into the skin daily at 10 pm. 15 mL 11  . Insulin Glargine (BASAGLAR KWIKPEN) 100 UNIT/ML Inject into the skin.    Marland Kitchen lisinopril (ZESTRIL) 10 MG tablet Take 10 mg by mouth daily.    . metFORMIN (GLUCOPHAGE) 1000 MG tablet Take 1,000 mg by mouth 2 (two) times daily with a meal.    . Multiple Vitamin (MULTIVITAMIN WITH MINERALS) TABS tablet Take 1 tablet by mouth daily.    . nadolol (CORGARD) 40 MG tablet Take  40 mg by mouth at bedtime.    . rosuvastatin (CRESTOR) 10 MG tablet Take 10 mg by mouth daily.    . Sofosbuvir-Velpatasvir 400-100 MG TABS Take 1 tablet by mouth daily.    . traMADol (ULTRAM) 50 MG tablet Take 1 tablet (50 mg total) by mouth every 6 (six) hours as needed. 30 tablet 0   No current facility-administered medications on file prior to visit.   Allergies  Allergen Reactions  . Sulfa Antibiotics Other (See Comments)    No results found for this or any previous visit (from the past 2160 hour(s)).  Objective: There were no vitals filed for this visit.  General: Patient is awake, alert, oriented x 3 and in no acute distress.  Dermatology: Skin is warm and dry bilateral with a preulcerative lesion right medial hallux that measures 1 x 1.5 cm this area appears to be a callus that is now hemorrhagic but was debrided the base is intact with no open wound.  There is very minimal surrounding swelling no erythema no warmth no active drainage no malodor.  Vascular: Dorsalis Pedis pulse = 1/4 Bilateral,  Posterior Tibial pulse = 1/4 Bilateral,  Capillary Fill Time < 5 seconds  Neurologic: Protective sensation intact using the 5.07/10g Semmes Weinstein Monofilament.  Musculosketal: There is no pain with palpation to preulcerative area. No pain  with compression to calves bilateral.  Mild overextension deformity noted at the right hallux.  Xrays, right foot: Hallux extensors noted on x-ray but there is no bony destruction suggestive of osteomyelitis at the area of concern. No gas in soft tissues.   No results for input(s): GRAMSTAIN, LABORGA in the last 8760 hours.  Assessment and Plan:  Problem List Items Addressed This Visit      Endocrine   Diabetes mellitus type 2, uncontrolled (HCC)   Relevant Medications   Insulin Glargine (BASAGLAR KWIKPEN) 100 UNIT/ML   lisinopril (ZESTRIL) 10 MG tablet   rosuvastatin (CRESTOR) 10 MG tablet    Other Visit Diagnoses    Right foot pain    -   Primary   Relevant Orders   DG Foot Complete Right   Blood blister       Pre-ulcerative calluses       Hallux extensus, acquired, right           -Examined patient and discussed the progression of the preulcerative lesion and treatment alternatives. -Xrays reviewed - Excisionally dedbrided preulcerative skin using a sterile tissue nipper.  Lesion base measures post debridement as above.  The lesion was debrided to the level of epidermis.  Patient tolerated procedure well without any discomfort or anesthesia necessary for this wound debridement.  -Applied Betadine and dry sterile dressing and instructed patient to continue with daily dressings at home consisting of the same daily for at least 1 week. - Advised patient to go to the ER or return to office if the wound worsens or if constitutional symptoms are present. -Advised patient to avoid shoes that may rub first toe -Patient to return to office in 4 week for follow up care and evaluation or sooner if problems arise.  Asencion Islam, DPM

## 2019-10-07 ENCOUNTER — Other Ambulatory Visit: Payer: Self-pay | Admitting: Sports Medicine

## 2019-10-07 DIAGNOSIS — L84 Corns and callosities: Secondary | ICD-10-CM

## 2019-11-05 ENCOUNTER — Ambulatory Visit: Payer: 59 | Admitting: Sports Medicine

## 2020-08-19 ENCOUNTER — Other Ambulatory Visit: Payer: Self-pay | Admitting: Nurse Practitioner

## 2020-08-19 DIAGNOSIS — B182 Chronic viral hepatitis C: Secondary | ICD-10-CM

## 2020-08-19 DIAGNOSIS — R748 Abnormal levels of other serum enzymes: Secondary | ICD-10-CM

## 2020-08-19 DIAGNOSIS — K7402 Hepatic fibrosis, advanced fibrosis: Secondary | ICD-10-CM

## 2021-03-01 DIAGNOSIS — R0600 Dyspnea, unspecified: Secondary | ICD-10-CM

## 2021-05-23 DIAGNOSIS — K8581 Other acute pancreatitis with uninfected necrosis: Secondary | ICD-10-CM

## 2021-11-18 IMAGING — US US ABDOMEN COMPLETE W/ ELASTOGRAPHY
1 series · 12 of 25 positions shown · non-contrast
Comparison: None.

CLINICAL DATA: Hepatitis C infection

EXAM:
ULTRASOUND ABDOMEN
ULTRASOUND HEPATIC ELASTOGRAPHY
TECHNIQUE: Sonography of the upper abdomen was performed. In addition,
ultrasound elastography evaluation of the liver was performed. A
region of interest was placed within the right lobe of the liver.
Following application of a compressive sonographic pulse, tissue
compressibility was assessed. Multiple assessments were performed at
the selected site. Median tissue compressibility was determined.
Previously, hepatic stiffness was assessed by shear wave velocity.
Based on recently published [employer] in [employer]trasound
consensus article, reporting is now recommended to be performed in
the SI units of pressure (kiloPascals) representing hepatic
stiffness/elasticity. The obtained result is compared to the
published reference standards. (cACLD = compensated Advanced Chronic
Liver Disease)

[Series 1: us abdomen complete w/ elastography · 0.17mm/px · 12 of 89 slices shown]
[im 4/89]
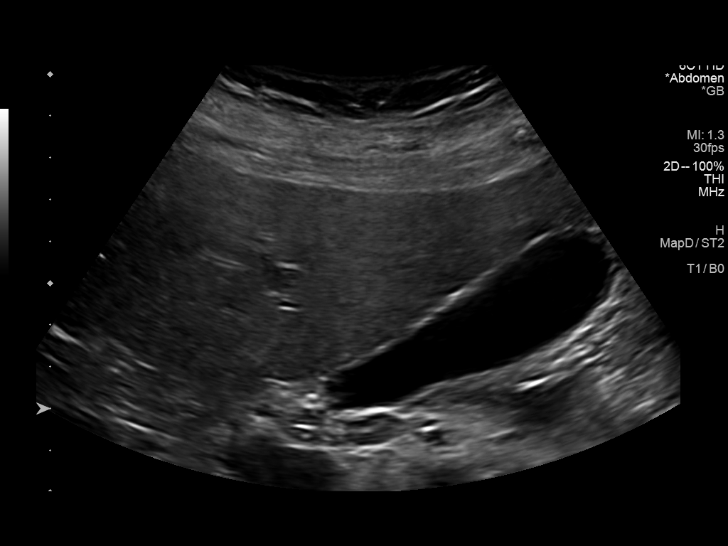
[im 12/89]
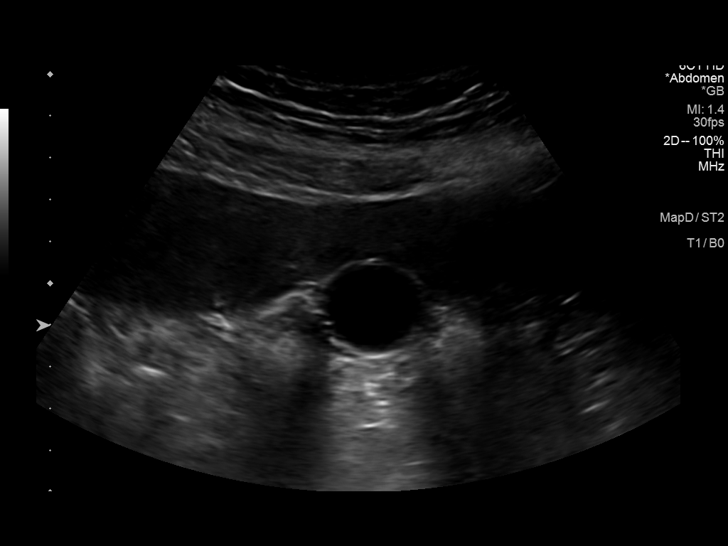
[im 19/89]
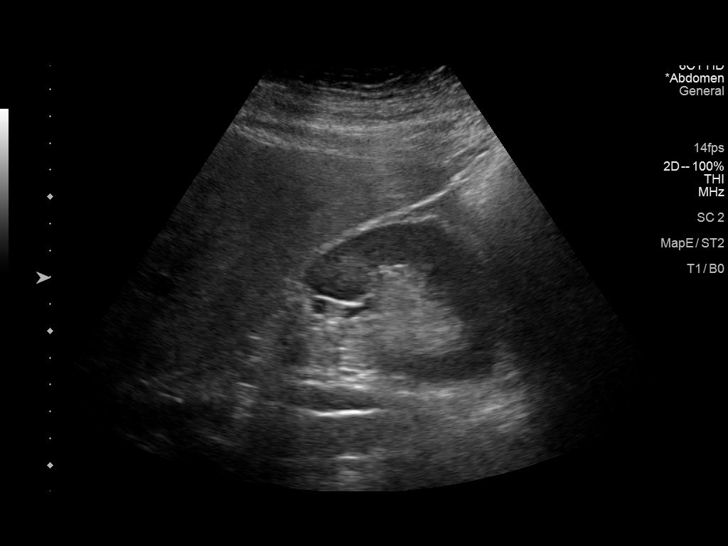
[im 26/89]
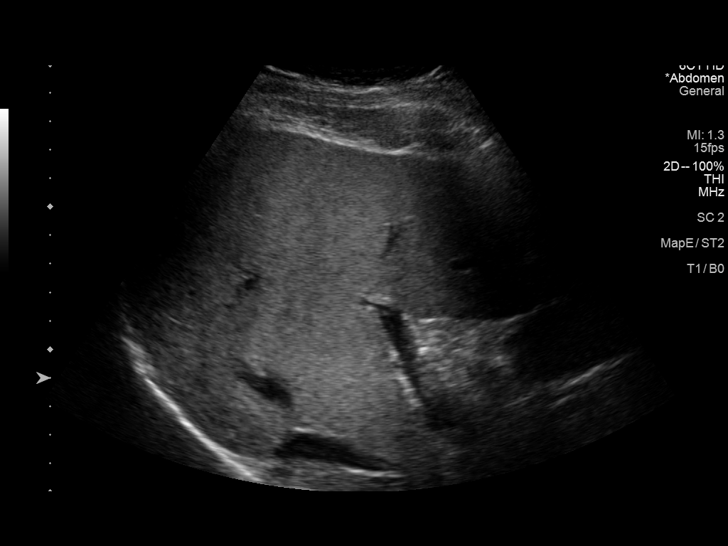
[im 34/89]
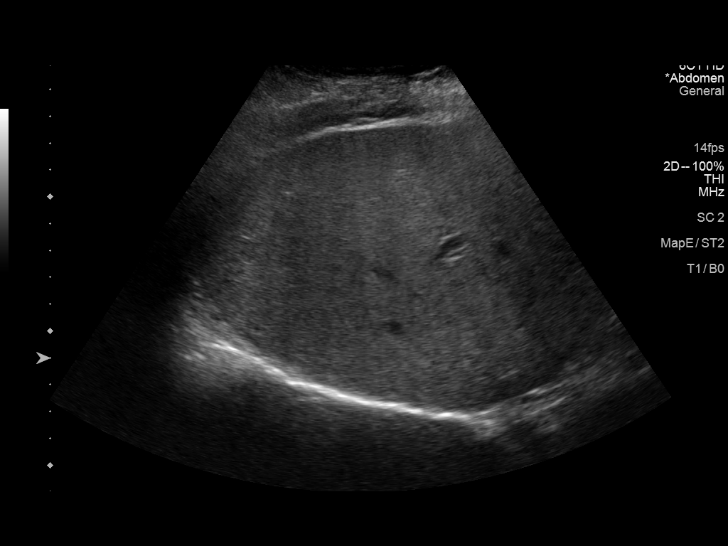
[im 41/89]
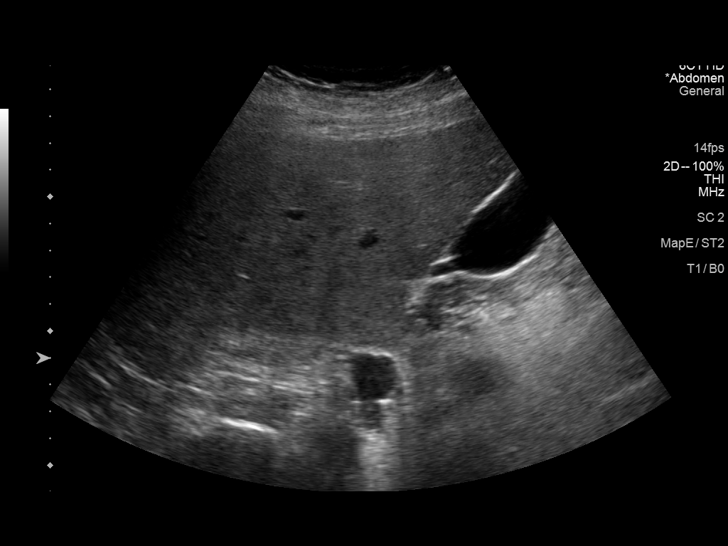
[im 48/89]
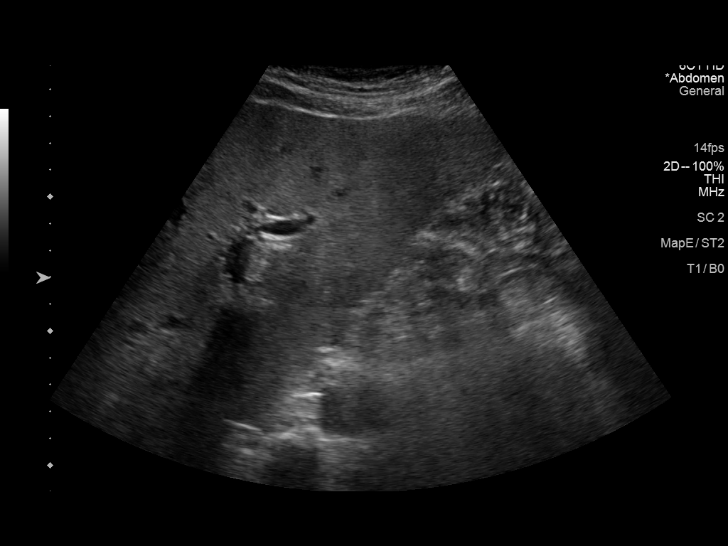
[im 56/89]
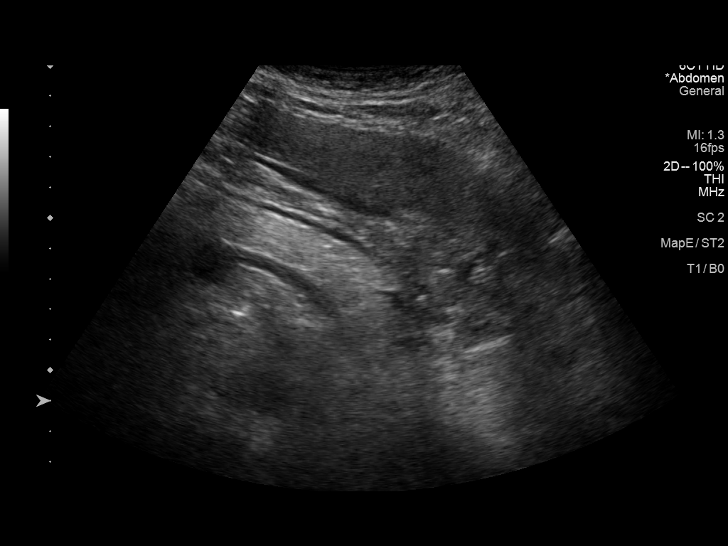
[im 63/89]
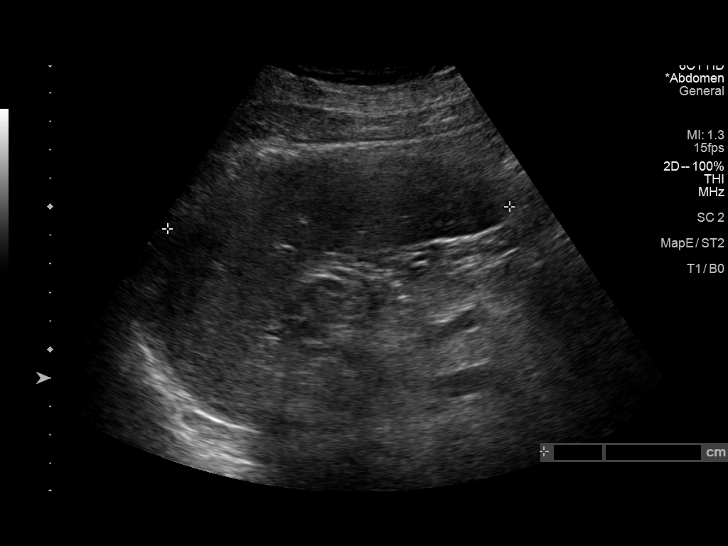
[im 70/89]
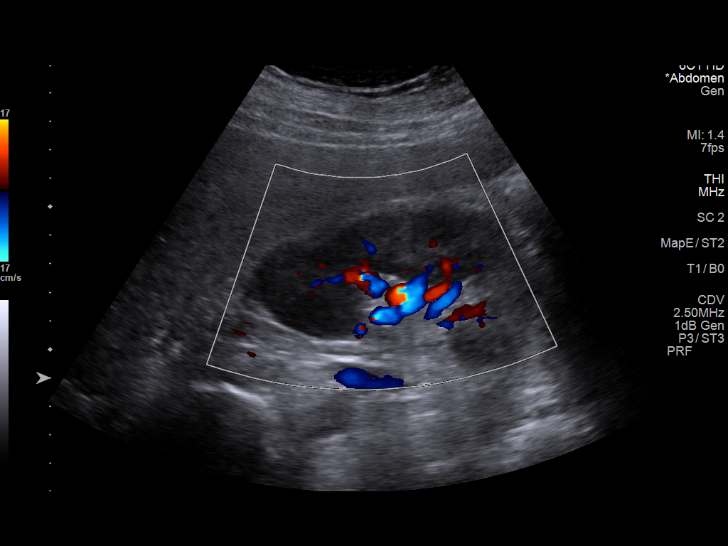
[im 78/89]
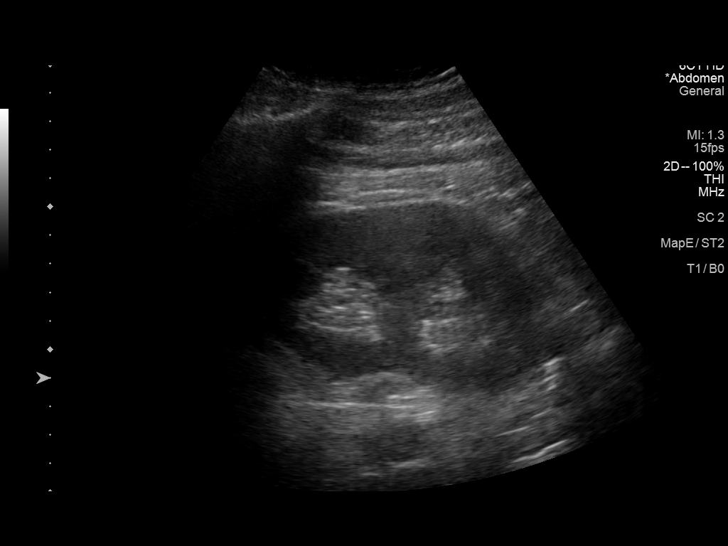
[im 85/89]
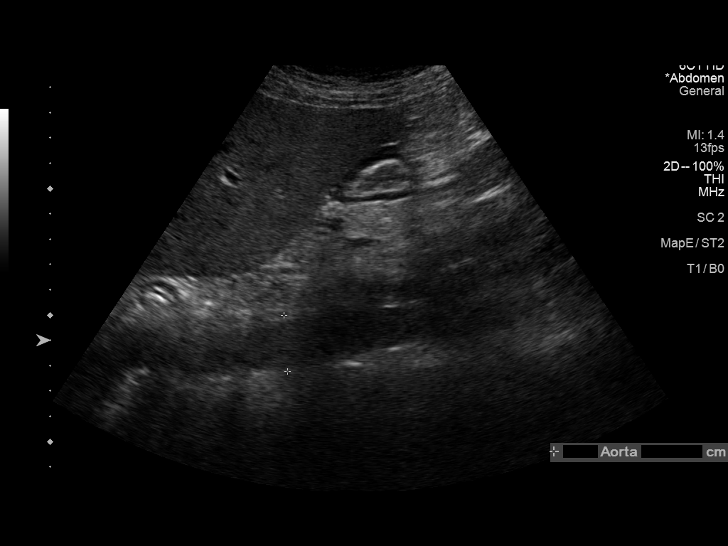

[12 of 25 positions shown; findings below may reference images not displayed]

FINDINGS: ULTRASOUND ABDOMEN

Gallbladder: No gallstones or wall thickening visualized. No
sonographic Murphy sign noted by sonographer.

Common bile duct: Diameter: 0.3 cm

Liver: No focal lesion identified. Within normal limits in
parenchymal echogenicity. Portal vein is patent on color Doppler
imaging with normal direction of blood flow towards the liver.

IVC: No abnormality visualized.

Pancreas: Visualized portion unremarkable. A 1.0 cm in short axis
structure on image 59/1 above the pancreatic head probably
represents an upper normal sized portacaval lymph node.

Spleen: Size and appearance within normal limits.

Right Kidney: Length: 12.0 cm. Echogenicity within normal limits. No
mass or hydronephrosis visualized.

Left Kidney: Length: 12.0 cm. Echogenicity within normal limits. No
mass or hydronephrosis visualized.

Abdominal aorta: No aneurysm visualized.

Other findings: None.

ULTRASOUND HEPATIC ELASTOGRAPHY

Device: Siemens Helix VTQ

Patient position: Oblique

Transducer [id number]

Number of measurements: 10

Hepatic segment:  8

Median kPa:

IQR:

IQR/Median kPa ratio:

Data quality: IQR/Median kPa ratio of 0.3 or greater indicates
reduced accuracy

Diagnostic category:  < or = 5 kPa: high probability of being normal
IMPRESSION: ULTRASOUND ABDOMEN:

1. Upper normal sized portacaval lymph node. Otherwise unremarkable.

ULTRASOUND HEPATIC ELASTOGRAPHY:

Median kPa:

Diagnostic category:  < or = 5 kPa: high probability of being normal

IQR/Median kPa ratio of 0.3 or greater indicates reduced accuracy

The use of hepatic elastography is applicable to patients with viral
hepatitis and non-alcoholic fatty liver disease. At this time, there
is insufficient data for the referenced cut-off values and use in
other causes of liver disease, including alcoholic liver disease.
Patients, however, may be assessed by elastography and serve as
their own reference standard/baseline.

In patients with non-alcoholic liver disease, the values suggesting
compensated advanced chronic liver disease (cACLD) may be lower, and
patients may need additional testing with elasticity results of [DATE]
kPa.

Please note that abnormal hepatic elasticity and shear wave
velocities may also be identified in clinical settings other than
with hepatic fibrosis, such as: acute hepatitis, elevated right
heart and central venous pressures including use of beta blockers,
Sharo disease (Marcella), infiltrative processes such as
mastocytosis/amyloidosis/infiltrative tumor/lymphoma, extrahepatic
cholestasis, with hyperemia in the post-prandial state, and with
liver transplantation. Correlation with patient history, laboratory
data, and clinical condition recommended.

Diagnostic Categories:

< or =5 kPa: high probability of being normal

< or =9 kPa: in the absence of other known clinical signs, rules [DATE] kPa and ?13 kPa: suggestive of cACLD, but needs further testing

>13 kPa: highly suggestive of cACLD

> or =17 kPa: highly suggestive of cACLD with an increased
probability of clinically significant portal hypertension
